# Patient Record
Sex: Male | Born: 1980 | Race: White | Hispanic: No | Marital: Single | Smoking: Former smoker
Health system: Southern US, Community
[De-identification: ages and names within clinical notes are randomized; demographics above are authoritative.]

## PROBLEM LIST (undated history)

## (undated) DIAGNOSIS — J342 Deviated nasal septum: Secondary | ICD-10-CM

## (undated) DIAGNOSIS — J343 Hypertrophy of nasal turbinates: Secondary | ICD-10-CM

## (undated) DIAGNOSIS — F1910 Other psychoactive substance abuse, uncomplicated: Secondary | ICD-10-CM

## (undated) HISTORY — DX: Other psychoactive substance abuse, uncomplicated: F19.10

## (undated) HISTORY — DX: Deviated nasal septum: J34.2

## (undated) HISTORY — PX: OTHER SURGICAL HISTORY: SHX169

## (undated) HISTORY — DX: Hypertrophy of nasal turbinates: J34.3

---

## 2014-09-23 ENCOUNTER — Ambulatory Visit (INDEPENDENT_AMBULATORY_CARE_PROVIDER_SITE_OTHER): Payer: BLUE CROSS/BLUE SHIELD | Admitting: Internal Medicine

## 2014-09-23 VITALS — BP 120/66 | HR 83 | Temp 98.0°F | Resp 18 | Ht 78.0 in | Wt 240.0 lb

## 2014-09-23 DIAGNOSIS — B354 Tinea corporis: Secondary | ICD-10-CM | POA: Diagnosis not present

## 2014-09-23 MED ORDER — KETOCONAZOLE 2 % EX CREA: 1.0000 "application " | TOPICAL_CREAM | Freq: Every day | CUTANEOUS | Status: DC

## 2014-09-23 NOTE — Progress Notes (Signed)
° °  Subjective:  This chart was scribed for Oscar Sia, MD by Broadus John, Medical Scribe. This patient was seen in Room 10 and the patient's care was started at 4:06 PM.   Patient ID: Oscar Alexander, male    DOB: Jul 28, 1980, 34 y.o.   MRN: [id number]  Chief Complaint  Patient presents with   Tinea    abd area and rt shoulder     HPI HPI Comments: Oscar Alexander is a 35 y.o. male who presents to [facility] complaining of a possible tinea. Pt notes that he has recently got a new cat 3 months old about 2 weeks ago. He reports that the cat was treated for the symptoms of tinea. Pt states that he now has skin rash on his lower abdominal and right shoulder area.    There are no active problems to display for this patient.  Past Medical History  Diagnosis Date   Substance abuse    Past Surgical History  Procedure Laterality Date   Nuss for pe     No Known Allergies Prior to Admission medications   Not on File   History   Social History   Marital Status: Single    Spouse Name: Oscar Alexander   Number of Children: Oscar Alexander   Years of Education: Oscar Alexander   Occupational History   Not on file.   Social History Main Topics   Smoking status: Current Every Day Smoker -- 1.00 packs/day for 1 years    Types: Cigarettes   Smokeless tobacco: Not on file   Alcohol Use: No   Drug Use: No   Sexual Activity: Not on file   Other Topics Concern   Not on file   Social History Narrative   No narrative on file    Review of Systems  Skin: Positive for rash.      Objective:   Physical Exam  Constitutional: He is oriented to person, place, and time. He appears well-developed and well-nourished. No distress.  HENT:  Head: Normocephalic and atraumatic.  Eyes: EOM are normal. Pupils are equal, round, and reactive to light.  Neck: Neck supple.  Cardiovascular: Normal rate.   Pulmonary/Chest: Effort normal.  Neurological: He is alert and oriented to person,  place, and time. No cranial nerve deficit.  Skin: Skin is warm and dry.  He has 2 erythematous papular circular lesions with well-defined borders one in the left inguinal area and one on the right shoulder that have scaly surfaces  Psychiatric: He has a normal mood and affect. His behavior is normal.  Nursing note and vitals reviewed.  BP 120/66 mmHg   Pulse 83   Temp(Src) 98 F (36.7 C) (Oral)   Resp 18   Ht  (1.981 m)   Wt 240 lb (108.863 kg)   BMI 27.74 kg/m2   SpO2 99%     Assessment & Plan:  I have completed the patient encounter in its entirety as documented by the scribe, with editing by me where necessary. Oscar Alexander, M.D.  Tinea corporis  Nizoral daily for one month

## 2014-10-26 ENCOUNTER — Ambulatory Visit (INDEPENDENT_AMBULATORY_CARE_PROVIDER_SITE_OTHER): Payer: BLUE CROSS/BLUE SHIELD | Admitting: Internal Medicine

## 2014-10-26 VITALS — BP 100/68 | HR 79 | Temp 98.2°F | Ht 78.0 in | Wt 230.5 lb

## 2014-10-26 DIAGNOSIS — H00036 Abscess of eyelid left eye, unspecified eyelid: Secondary | ICD-10-CM | POA: Diagnosis not present

## 2014-10-26 MED ORDER — DOXYCYCLINE HYCLATE 100 MG PO TABS: 100.0000 mg | ORAL_TABLET | Freq: Two times a day (BID) | ORAL | Status: DC

## 2014-10-26 NOTE — Progress Notes (Signed)
   Subjective:  This chart was scribed for Oscar Sia, MD by Stann Ore, Medical Scribe. This patient was seen in Room 9 and the patient's care was started at 2:46 PM.     Patient ID: Oscar Alexander, male    DOB: Sep 30, 1980, 34 y.o.   MRN: [id number] Chief Complaint  Patient presents with  . Stye    C/O left eye pain & irritation since Thurs night. Worse today & now has a headache    HPI Oscar Alexander is a 34 y.o. male who presents to [facility] complaining of a stye in his left eye that started 4 days ago. He noticed more swelling this morning. He denies any drainage from the area. He's been applying heat to the area.    There are no active problems to display for this patient.   Current outpatient prescriptions:  .  ketoconazole (NIZORAL) 2 % cream, Apply 1 application topically daily. (Patient not taking: Reported on 10/26/2014), Disp: 15 g, Rfl: 0    Review of Systems  Eyes: Positive for pain (left eye stye). Negative for discharge and visual disturbance.       Objective:   Physical Exam  Constitutional: He is oriented to person, place, and time. He appears well-developed and well-nourished. No distress.  HENT:  Head: Normocephalic and atraumatic.  Eyes: EOM are normal. Pupils are equal, round, and reactive to light.  Left upper lid red and swollen with a pustule at the base of a central hair follicle. Conjunctiva clear  Neck: Neck supple.  Cardiovascular: Normal rate.   Pulmonary/Chest: Effort normal. No respiratory distress.  Musculoskeletal: Normal range of motion.  Neurological: He is alert and oriented to person, place, and time.  Skin: Skin is warm and dry.  Psychiatric: He has a normal mood and affect. His behavior is normal.  Nursing note and vitals reviewed.   BP 100/68 mmHg  Pulse 79  Temp(Src) 98.2 F (36.8 C) (Oral)  Ht  (1.981 m)  Wt 230 lb 8 oz (104.554 kg)  BMI 26.64 kg/m2  SpO2 99%       Assessment & Plan:   Cellulitis of eyelid,  left  Meds ordered this encounter  Medications  . doxycycline (VIBRA-TABS) 100 MG tablet    Sig: Take 1 tablet (100 mg total) by mouth 2 (two) times daily.    Dispense:  20 tablet    Refill:  0  hot compr qid 5'

## 2015-03-13 ENCOUNTER — Ambulatory Visit (INDEPENDENT_AMBULATORY_CARE_PROVIDER_SITE_OTHER): Payer: PRIVATE HEALTH INSURANCE | Admitting: Physician Assistant

## 2015-03-13 VITALS — BP 132/80 | HR 99 | Temp 98.4°F | Resp 18 | Ht 78.0 in | Wt 251.4 lb

## 2015-03-13 DIAGNOSIS — B349 Viral infection, unspecified: Secondary | ICD-10-CM

## 2015-03-13 LAB — POCT INFLUENZA A/B
INFLUENZA A, POC: NEGATIVE
INFLUENZA B, POC: NEGATIVE

## 2015-03-13 MED ORDER — IPRATROPIUM BROMIDE 0.03 % NA SOLN: 2.0000 | Freq: Two times a day (BID) | NASAL | Status: DC

## 2015-03-13 MED ORDER — GUAIFENESIN ER 1200 MG PO TB12: 1.0000 | ORAL_TABLET | Freq: Two times a day (BID) | ORAL | Status: DC | PRN

## 2015-03-13 MED ORDER — HYDROCOD POLST-CPM POLST ER 10-8 MG/5ML PO SUER: 5.0000 mL | Freq: Two times a day (BID) | ORAL | Status: DC | PRN

## 2015-03-13 NOTE — Patient Instructions (Signed)
Upper Respiratory Infection, Adult Most upper respiratory infections (URIs) are a viral infection of the air passages leading to the lungs. A URI affects the nose, throat, and upper air passages. The most common type of URI is nasopharyngitis and is typically referred to as "the common cold." URIs run their course and usually go away on their own. Most of the time, a URI does not require medical attention, but sometimes a bacterial infection in the upper airways can follow a viral infection. This is called a secondary infection. Sinus and middle ear infections are common types of secondary upper respiratory infections. Bacterial pneumonia can also complicate a URI. A URI can worsen asthma and chronic obstructive pulmonary disease (COPD). Sometimes, these complications can require emergency medical care and may be life threatening.  CAUSES Almost all URIs are caused by viruses. A virus is a type of germ and can spread from one person to another.  RISKS FACTORS You may be at risk for a URI if:   You smoke.   You have chronic heart or lung disease.  You have a weakened defense (immune) system.   You are very young or very old.   You have nasal allergies or asthma.  You work in crowded or poorly ventilated areas.  You work in health care facilities or schools. SIGNS AND SYMPTOMS  Symptoms typically develop 2-3 days after you come in contact with a cold virus. Most viral URIs last 7-10 days. However, viral URIs from the influenza virus (flu virus) can last 14-18 days and are typically more severe. Symptoms may include:   Runny or stuffy (congested) nose.   Sneezing.   Cough.   Sore throat.   Headache.   Fatigue.   Fever.   Loss of appetite.   Pain in your forehead, behind your eyes, and over your cheekbones (sinus pain).  Muscle aches.  DIAGNOSIS  Your health care provider may diagnose a URI by:  Physical exam.  Tests to check that your symptoms are not due to  another condition such as:  Strep throat.  Sinusitis.  Pneumonia.  Asthma. TREATMENT  A URI goes away on its own with time. It cannot be cured with medicines, but medicines may be prescribed or recommended to relieve symptoms. Medicines may help:  Reduce your fever.  Reduce your cough.  Relieve nasal congestion. HOME CARE INSTRUCTIONS   Take medicines only as directed by your health care provider.   Gargle warm saltwater or take cough drops to comfort your throat as directed by your health care provider.  Use a warm mist humidifier or inhale steam from a shower to increase air moisture. This may make it easier to breathe.  Drink enough fluid to keep your urine clear or pale yellow.   Eat soups and other clear broths and maintain good nutrition.   Rest as needed.   Return to work when your temperature has returned to normal or as your health care provider advises. You may need to stay home longer to avoid infecting others. You can also use a face mask and careful hand washing to prevent spread of the virus.  Increase the usage of your inhaler if you have asthma.   Do not use any tobacco products, including cigarettes, chewing tobacco, or electronic cigarettes. If you need help quitting, ask your health care provider. PREVENTION  The best way to protect yourself from getting a cold is to practice good hygiene.   Avoid oral or hand contact with people with cold   symptoms.   Wash your hands often if contact occurs.  There is no clear evidence that vitamin C, vitamin E, echinacea, or exercise reduces the chance of developing a cold. However, it is always recommended to get plenty of rest, exercise, and practice good nutrition.  SEEK MEDICAL CARE IF:   You are getting worse rather than better.   Your symptoms are not controlled by medicine.   You have chills.  You have worsening shortness of breath.  You have brown or red mucus.  You have yellow or brown nasal  discharge.  You have pain in your face, especially when you bend forward.  You have a fever.  You have swollen neck glands.  You have pain while swallowing.  You have white areas in the back of your throat. SEEK IMMEDIATE MEDICAL CARE IF:   You have severe or persistent:  Headache.  Ear pain.  Sinus pain.  Chest pain.  You have chronic lung disease and any of the following:  Wheezing.  Prolonged cough.  Coughing up blood.  A change in your usual mucus.  You have a stiff neck.  You have changes in your:  Vision.  Hearing.  Thinking.  Mood. MAKE SURE YOU:   Understand these instructions.  Will watch your condition.  Will get help right away if you are not doing well or get worse.   This information is not intended to replace advice given to you by your health care provider. Make sure you discuss any questions you have with your health care provider.   Document Released: 08/10/2000 Document Revised: 07/01/2014 Document Reviewed: 05/22/2013 Elsevier Interactive Patient Education 2016 Elsevier Inc[employer]  

## 2015-03-13 NOTE — Progress Notes (Signed)
Patient ID: Oscar Alexander, male    DOB: [birth year], 35 y.o.   MRN: [id number]  PCP: No PCP Per Patient  Subjective:   Chief Complaint  Patient presents with  . URI    x3 days, productive cough with yellow phlegm     HPI Presents for evaluation of illness. 3 days of worsening symptoms. Feels achy all over. Skin crawling. "Just feel awful." Cold symptoms seem pretty mild compared to what he normally gets. No nausea, vomiting or diarrhea. No GU changes. Subjective fever. OTC Nyquil last night helped him sleep through the night.  Review of Systems  Constitutional: Positive for fever and fatigue. Negative for chills and diaphoresis.  HENT: Positive for congestion, ear pain, postnasal drip, rhinorrhea, sinus pressure and sore throat. Negative for trouble swallowing and voice change.   Eyes: Negative for pain, redness and itching.  Respiratory: Positive for cough. Negative for shortness of breath and wheezing.   Cardiovascular: Negative for chest pain and palpitations.  Gastrointestinal: Negative for nausea, vomiting and diarrhea.  Genitourinary: Negative for dysuria, urgency, frequency and hematuria.  Musculoskeletal: Positive for myalgias. Negative for joint swelling, arthralgias, neck pain and neck stiffness.  Skin: Negative for rash.  Neurological: Negative for dizziness and headaches.  Hematological: Negative for adenopathy.       There are no active problems to display for this patient.    Prior to Admission medications   Not on File     No Known Allergies     Objective:  Physical Exam  Constitutional: He is oriented to person, place, and time. Vital signs are normal. He appears well-developed and well-nourished. No distress.  BP 132/80 mmHg  Pulse 99  Temp(Src) 98.4 F (36.9 C) (Oral)  Resp 18  Ht 6\' 6"  (1.981 m)  Wt 251 lb 6.4 oz (114.034 kg)  BMI 29.06 kg/m2  SpO2 98%   HENT:  Head: Normocephalic and atraumatic.  Right Ear: Hearing, external ear  and ear canal normal. Tympanic membrane is injected and erythematous. Tympanic membrane is not perforated, not retracted and not bulging. No middle ear effusion.  Left Ear: Hearing, tympanic membrane, external ear and ear canal normal.  Nose: Mucosal edema and rhinorrhea present.  No foreign bodies. Right sinus exhibits no maxillary sinus tenderness and no frontal sinus tenderness. Left sinus exhibits no maxillary sinus tenderness and no frontal sinus tenderness.  Mouth/Throat: Uvula is midline, oropharynx is clear and moist and mucous membranes are normal. No uvula swelling. No oropharyngeal exudate.  Eyes: Conjunctivae and EOM are normal. Pupils are equal, round, and reactive to light. Right eye exhibits no discharge. Left eye exhibits no discharge. No scleral icterus.  Neck: Trachea normal, normal range of motion and full passive range of motion without pain. Neck supple. No thyroid mass and no thyromegaly present.  Cardiovascular: Normal rate, regular rhythm and normal heart sounds.   Pulmonary/Chest: Effort normal and breath sounds normal.  Lymphadenopathy:       Head (right side): No submandibular, no tonsillar, no preauricular, no posterior auricular and no occipital adenopathy present.       Head (left side): No submandibular, no tonsillar, no preauricular and no occipital adenopathy present.    He has no cervical adenopathy.       Right: No supraclavicular adenopathy present.       Left: No supraclavicular adenopathy present.  Neurological: He is alert and oriented to person, place, and time. He has normal strength. No cranial nerve deficit or sensory deficit.  Skin: Skin  is warm, dry and intact. No rash noted.  Psychiatric: He has a normal mood and affect. His speech is normal and behavior is normal.       Results for orders placed or performed in visit on 03/13/15  POCT Influenza A/B  Result Value Ref Range   Influenza A, POC Negative Negative   Influenza B, POC Negative Negative        Assessment & Plan:   1. Viral illness Supportive care. Rest, oral hydration. Declines Tussionex due to history of substance abuse (nearly 1 year clean) and tessalon because they haven't been effective for him previously. Anticipatory guidance. - POCT Influenza A/B - ipratropium (ATROVENT) 0.03 % nasal spray; Place 2 sprays into both nostrils 2 (two) times daily.  Dispense: 30 mL; Refill: 0 - Guaifenesin (MUCINEX MAXIMUM STRENGTH) 1200 MG TB12; Take 1 tablet (1,200 mg total) by mouth every 12 (twelve) hours as needed.  Dispense: 14 tablet; Refill: 1  Return if symptoms worsen or fail to improve.   Fernande Brashelle S. Aryaman Haliburton, PA-C Physician Assistant-Certified [facility]

## 2015-04-08 ENCOUNTER — Ambulatory Visit (INDEPENDENT_AMBULATORY_CARE_PROVIDER_SITE_OTHER): Payer: PRIVATE HEALTH INSURANCE | Admitting: Emergency Medicine

## 2015-04-08 ENCOUNTER — Ambulatory Visit (INDEPENDENT_AMBULATORY_CARE_PROVIDER_SITE_OTHER): Payer: PRIVATE HEALTH INSURANCE

## 2015-04-08 VITALS — BP 140/64 | HR 113 | Temp 100.2°F | Resp 17 | Ht 78.0 in | Wt 257.0 lb

## 2015-04-08 DIAGNOSIS — R509 Fever, unspecified: Secondary | ICD-10-CM | POA: Diagnosis not present

## 2015-04-08 DIAGNOSIS — T8090XA Unspecified complication following infusion and therapeutic injection, initial encounter: Secondary | ICD-10-CM | POA: Diagnosis not present

## 2015-04-08 DIAGNOSIS — J029 Acute pharyngitis, unspecified: Secondary | ICD-10-CM | POA: Diagnosis not present

## 2015-04-08 DIAGNOSIS — B349 Viral infection, unspecified: Secondary | ICD-10-CM

## 2015-04-08 LAB — POCT INFLUENZA A/B
INFLUENZA A, POC: NEGATIVE
INFLUENZA B, POC: NEGATIVE

## 2015-04-08 LAB — POCT CBC
Granulocyte percent: 89.5 %G — AB (ref 37–80)
HCT, POC: 44.2 % (ref 43.5–53.7)
HEMOGLOBIN: 15 g/dL (ref 14.1–18.1)
Lymph, poc: 1 (ref 0.6–3.4)
MCH, POC: 29.3 pg (ref 27–31.2)
MCHC: 34 g/dL (ref 31.8–35.4)
MCV: 86.2 fL (ref 80–97)
MID (CBC): 0.4 (ref 0–0.9)
MPV: 8.4 fL (ref 0–99.8)
POC Granulocyte: 12 — AB (ref 2–6.9)
POC LYMPH %: 7.3 % — AB (ref 10–50)
POC MID %: 3.2 % (ref 0–12)
Platelet Count, POC: 165 10*3/uL (ref 142–424)
RBC: 5.12 M/uL (ref 4.69–6.13)
RDW, POC: 14.9 %
WBC: 13.4 10*3/uL — AB (ref 4.6–10.2)

## 2015-04-08 LAB — COMPLETE METABOLIC PANEL WITH GFR
ALBUMIN: 4.2 g/dL (ref 3.6–5.1)
ALK PHOS: 83 U/L (ref 40–115)
ALT: 18 U/L (ref 9–46)
AST: 23 U/L (ref 10–40)
BILIRUBIN TOTAL: 0.6 mg/dL (ref 0.2–1.2)
BUN: 16 mg/dL (ref 7–25)
CO2: 27 mmol/L (ref 20–31)
Calcium: 9.3 mg/dL (ref 8.6–10.3)
Chloride: 99 mmol/L (ref 98–110)
Creat: 1.05 mg/dL (ref 0.60–1.35)
GLUCOSE: 84 mg/dL (ref 65–99)
POTASSIUM: 4.7 mmol/L (ref 3.5–5.3)
SODIUM: 136 mmol/L (ref 135–146)
TOTAL PROTEIN: 7.2 g/dL (ref 6.1–8.1)

## 2015-04-08 LAB — POCT RAPID STREP A (OFFICE): RAPID STREP A SCREEN: NEGATIVE

## 2015-04-08 MED ORDER — CEPHALEXIN 500 MG PO CAPS
500.0000 mg | ORAL_CAPSULE | Freq: Three times a day (TID) | ORAL | Status: DC
Start: 2015-04-08 — End: 2016-05-30

## 2015-04-08 NOTE — Progress Notes (Addendum)
Patient ID: Oscar Alexander, male   DOB: [birth year], 36 y.o.   MRN: [id number]    By signing my name below, I, Essence Howell, attest that this documentation has been prepared under the direction and in the presence of Collene Gobble, MD Electronically Signed: Charline Bills, ED Scribe 04/08/2015 at 12:30 PM.  Chief Complaint:  Chief Complaint  Patient presents with  . Diarrhea  . Chills  . Sore Throat  . Otalgia  . Headache   HPI: Oscar Alexander is a 35 y.o. male who reports to [facility] today complaining of persistent sore throat onset yesterday. Pt states that sore throat started as a scratchy sensation. He reports associated fatigue, fever, chills, loss of appetite, ear pain, generalized body aches, back pain, HA, 2 episodes of diarrhea today. Triage temperature 100.2 F. No treatments tried PTA. He denies cough. Pt was also seen in the office on 03/13/15 for a viral illness with similar symptoms; tested negative for the flu at that visit. He did not have a flu vaccine this season.   Body Building Pt is currently in a body building program doing testosterone injections every 3 days. He reports similar symptoms following 1 of his testosterone injections.   Pt is a Community education officer.   Past Medical History  Diagnosis Date  . Substance abuse    Past Surgical History  Procedure Laterality Date  . Nuss for pe     Social History   Social History  . Marital Status: Single    Spouse Name: n/a  . Number of Children: 0  . Years of Education: N/A   Occupational History  . car sales    Social History Main Topics  . Smoking status: Current Every Day Smoker -- 1.00 packs/day for 1 years    Types: Cigarettes  . Smokeless tobacco: Never Used  . Alcohol Use: No  . Drug Use: No  . Sexual Activity: Not Asked   Other Topics Concern  . None   Social History Narrative   Lives with a roommate.   Family History  Problem Relation Age of Onset  . Heart disease Maternal Grandfather   . Cancer  Paternal Grandmother    No Known Allergies Prior to Admission medications   Not on File   ROS: The patient denies night sweats, unintentional weight loss, chest pain, palpitations, -cough, wheezing, dyspnea on exertion, nausea, vomiting, abdominal pain, dysuria, hematuria, melena, numbness, weakness, or tingling. +fatigue, +fever, +chills, +appetite change, +sore throat, +ear pain, +diarrhea, +myalagias, +back pain, +HA  All other systems have been reviewed and were otherwise negative with the exception of those mentioned in the HPI and as above.    PHYSICAL EXAM: Filed Vitals:   04/08/15 1100  BP: 140/64  Pulse: 113  Temp: 100.2 F (37.9 C)  Resp: 17   Body mass index is 29.71 kg/(m^2).  General: Alert, no acute distress HEENT:  Normocephalic, atraumatic, oropharynx patent. L TM is slightly red. Throat normal.  Eye: E[facility], [facility] Cardiovascular:  Regular rate and rhythm, no rubs murmurs or gallops. No Carotid bruits, radial pulse intact. No pedal edema.  Respiratory: Clear to auscultation bilaterally. No wheezes, rales, or rhonchi. No cyanosis, no use of accessory musculature Abdominal: No organomegaly, abdomen is soft and non-tender, positive bowel sounds. No masses. Musculoskeletal: Gait intact. No edema, tenderness GU: the L buttock is firm and tender to touch. There is asymmetry in the buttock, L greater than R.  Skin: No rashes. Neurologic: Facial musculature symmetric. Psychiatric: Patient acts appropriately  throughout our interaction. Lymphatic: No cervical or submandibular lymphadenopathy  LABS: Results for orders placed or performed in visit on 04/08/15  POCT Influenza A/B  Result Value Ref Range   Influenza A, POC Negative Negative   Influenza B, POC Negative Negative  POCT rapid strep A  Result Value Ref Range   Rapid Strep A Screen Negative Negative  POCT CBC  Result Value Ref Range   WBC 13.4 (A) 4.6 - 10.2 K/uL   Lymph, poc 1.0 0.6 - 3.4   POC LYMPH  PERCENT 7.3 (A) 10 - 50 %L   MID (cbc) 0.4 0 - 0.9   POC MID % 3.2 0 - 12 %M   POC Granulocyte 12.0 (A) 2 - 6.9   Granulocyte percent 89.5 (A) 37 - 80 %G   RBC 5.12 4.69 - 6.13 M/uL   Hemoglobin 15.0 14.1 - 18.1 g/dL   HCT, POC 91.4 78.2 - 53.7 %   MCV 86.2 80 - 97 fL   MCH, POC 29.3 27 - 31.2 pg   MCHC 34.0 31.8 - 35.4 g/dL   RDW, POC 95.6 %   Platelet Count, POC 165 142 - 424 K/uL   MPV 8.4 0 - 99.8 fL   Results for orders placed or performed in visit on 03/13/15  POCT Influenza A/B  Result Value Ref Range   Influenza A, POC Negative Negative   Influenza B, POC Negative Negative   EKG/XRAY:   Primary read interpreted by Dr. Cleta Alberts at [facility]. Dg Chest 2 View  04/08/2015  CLINICAL DATA:  Cough and fever with cold symptoms. History of thoracic surgery. EXAM: CHEST  2 VIEW COMPARISON:  None. FINDINGS: The heart size and mediastinal contours are normal. The lungs are clear there is no pleural effusion or pneumothorax. 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 No acute osseous findings are seen. IMPRESSION: No active cardiopulmonary process. Electronically Signed   By: Carey Bullocks M.D.   On: 04/08/2015 13:15   ASSESSMENT/PLAN: Not clear what is going on. He has flulike symptoms but also has tenderness over his left buttocks where he recently gave a testosterone injection. He certainly could be developing a access in this area. It is also possible he is having some type of drug reaction to the testosterone. I advised him not to do any more testosterone injections. I did place him on cephalexin 3 times a day. He will recheck on Friday for follow-up. He was given a note out of work. Of note these symptoms were very similar to what he had 3 weeks ago when he was seen here and also this followed an injection of the testosterone. Will add a cemented check his LFTs.I personally performed the services described in this  documentation, which was scribed in my presence. The recorded information has been reviewed and is accurate.    Gross sideeffects, risk and benefits, and alternatives of medications d/w patient. Patient is aware that all medications have potential sideeffects and we are unable to predict every sideeffect or drug-drug interaction that may occur.  Lesle Chris MD 04/08/2015 12:18 PM

## 2015-04-08 NOTE — Patient Instructions (Signed)
Because you received an x-ray today, you will receive an invoice from [facility]. Please contact [facility] at [phone] with questions or concerns regarding your invoice. Our billing staff will not be able to assist you with those questions. °

## 2016-03-23 DIAGNOSIS — J343 Hypertrophy of nasal turbinates: Secondary | ICD-10-CM

## 2016-03-23 DIAGNOSIS — J342 Deviated nasal septum: Secondary | ICD-10-CM

## 2016-03-23 HISTORY — DX: Hypertrophy of nasal turbinates: J34.3

## 2016-03-23 HISTORY — DX: Deviated nasal septum: J34.2

## 2016-05-29 ENCOUNTER — Encounter (HOSPITAL_COMMUNITY): Payer: Self-pay | Admitting: Emergency Medicine

## 2016-05-29 ENCOUNTER — Ambulatory Visit (HOSPITAL_COMMUNITY)
Admission: EM | Admit: 2016-05-29 | Discharge: 2016-05-29 | Disposition: A | Discharge status: Home / Self Care | Payer: BLUE CROSS/BLUE SHIELD | Attending: Family Medicine | Admitting: Family Medicine

## 2016-05-29 DIAGNOSIS — M545 Low back pain, unspecified: Secondary | ICD-10-CM

## 2016-05-29 MED ORDER — BUPIVACAINE HCL (PF) 0.5 % IJ SOLN
INTRAMUSCULAR | Status: AC
  Filled 2016-05-29: qty 10

## 2016-05-29 MED ORDER — METHOCARBAMOL 500 MG PO TABS: 500.0000 mg | ORAL_TABLET | Freq: Two times a day (BID) | ORAL | 0 refills | Status: DC

## 2016-05-29 MED ORDER — TRIAMCINOLONE ACETONIDE 40 MG/ML IJ SUSP
INTRAMUSCULAR | Status: AC
Start: 2016-05-29 — End: 2016-05-29
  Filled 2016-05-29: qty 1

## 2016-05-29 MED ORDER — PREDNISONE 50 MG PO TABS: ORAL_TABLET | ORAL | 0 refills | Status: DC

## 2016-05-29 NOTE — ED Triage Notes (Signed)
PT reports he worked out legs at the gym Wednesday night and placed left leg up on table to tie his shoe. PT reports upon putting foot back on the floor he experienced severe pain in left lower back. PT reports pain has worsened since Wednesday. PT has had two doses of 800 mg ibuprofen today. PT has used prednisone in the past for similar injury with relief.

## 2016-05-29 NOTE — Discharge Instructions (Signed)
You have received a trigger point injection of bupivacaine, and Kenalog into your back. I have prescribed Robaxin as a muscle relaxant, this medicine may cause drowsiness, do not drink alcohol or drive while taking it. Also prescribed a short dose of steroids, take one tablet daily with food starting tomorrow. Should your pain persist, follow-up with primary care provider, or return to clinic

## 2016-05-29 NOTE — ED Provider Notes (Signed)
CSN: [id number]     Arrival date & time 05/29/16  1921 History   First MD Initiated Contact with Patient 05/29/16 1955     Chief Complaint  Patient presents with  . Back Pain   (Consider location/radiation/quality/duration/timing/severity/associated sxs/prior Treatment) 36 year old male presents to clinic with a chief complaint of lower back pain for 3-4 days. Occurred while he was at the gym, he states he had lifted his leg onto a bench to tie shoe, and he experienced a sharp shooting pain in his back. He has had prior back pain like this in the past, treated successfully with steroids and muscle relaxants.   The history is provided by the patient.  Back Pain  Location:  Lumbar spine Quality:  Cramping, shooting and stabbing Radiates to:  Does not radiate Pain severity:  Severe Pain is:  Same all the time Onset quality:  Gradual Duration:  3 days Timing:  Constant Chronicity:  Recurrent Context: lifting heavy objects, recent injury and twisting   Context: not falling, not MCA, not MVA and not physical stress   Relieved by:  Nothing Worsened by:  Nothing Ineffective treatments:  Ibuprofen, NSAIDs and OTC medications Associated symptoms: no abdominal pain, no bladder incontinence, no bowel incontinence, no chest pain, no dysuria, no fever, no headaches, no numbness, no paresthesias, no tingling and no weakness     Past Medical History:  Diagnosis Date  . Substance abuse    Past Surgical History:  Procedure Laterality Date  . nuss for PE     Family History  Problem Relation Age of Onset  . Heart disease Maternal Grandfather   . Cancer Paternal Grandmother    Social History  Substance Use Topics  . Smoking status: Current Every Day Smoker    Packs/day: 0.00    Years: 1.00  . Smokeless tobacco: Current User  . Alcohol use No    Review of Systems  Constitutional: Negative for chills and fever.  Respiratory: Negative for cough, shortness of breath and wheezing.     Cardiovascular: Negative for chest pain.  Gastrointestinal: Negative for abdominal pain, bowel incontinence and nausea.  Genitourinary: Negative for bladder incontinence and dysuria.  Musculoskeletal: Positive for back pain. Negative for neck pain and neck stiffness.  Neurological: Negative for tingling, weakness, numbness, headaches and paresthesias.    Allergies  Skelaxin [metaxalone]  Home Medications   Prior to Admission medications   Medication Sig Start Date End Date Taking? Authorizing Provider  cephALEXin (KEFLEX) 500 MG capsule Take 1 capsule (500 mg total) by mouth 3 (three) times daily. 04/08/15   Collene Gobble, MD  methocarbamol (ROBAXIN) 500 MG tablet Take 1 tablet (500 mg total) by mouth 2 (two) times daily. 05/29/16   Dorena Bodo, NP  predniSONE (DELTASONE) 50 MG tablet Take 1 tablet daily with food 05/29/16   Dorena Bodo, NP   Meds Ordered and Administered this Visit  Medications - No data to display  BP (!) 158/84   Pulse 100   Temp 99 F (37.2 C) (Oral)   Resp 16   Ht  (1.981 m)   Wt 248 lb (112.5 kg)   SpO2 99%   BMI 28.66 kg/m  No data found.   Physical Exam  Constitutional: He is oriented to person, place, and time. He appears well-developed and well-nourished. No distress.  HENT:  Head: Normocephalic and atraumatic.  Right Ear: External ear normal.  Left Ear: External ear normal.  Cardiovascular: Normal rate and regular rhythm.  Pulmonary/Chest: Effort normal and breath sounds normal.  Musculoskeletal:       Lumbar back: He exhibits swelling, pain and spasm.       Back:  Neurological: He is alert and oriented to person, place, and time.  Skin: Skin is warm and dry. Capillary refill takes less than 2 seconds. He is not diaphoretic.  Psychiatric: He has a normal mood and affect. His behavior is normal.  Nursing note and vitals reviewed.   Urgent Care Course     .Joint Aspiration/Arthrocentesis Date/Time: 05/29/2016 8:23  PM Performed by: Dorena Bodo Authorized by: Elvina Sidle   Consent:    Consent obtained:  Verbal   Consent given by:  Patient   Risks discussed:  Bleeding, infection, nerve damage and pain   Alternatives discussed:  No treatment, alternative treatment and delayed treatment Location:    Location: muscle of the lower back above the hip. Anesthesia (see MAR for exact dosages):    Anesthesia method:  Local infiltration   Local anesthetic:  Bupivacaine 0.5% w/o epi Procedure details:    Needle gauge:  22 G   Ultrasound guidance: no     Approach:  Posterior   Aspirate amount:  0   Steroid injected: yes     Specimen collected: no   Post-procedure details:    Dressing:  Adhesive bandage   Patient tolerance of procedure:  Tolerated well, no immediate complications Comments:     Trigger point injection to a muscle spasm in the lower back, no return on aspiration, bupivacaine and Kenalog injected into the site, pt experienced significant relief.   (including critical care time)  Labs Review Labs Reviewed - No data to display  Imaging Review No results found.         MDM   1. Acute left-sided low back pain without sciatica    Trigger point injection done in clinic, with significant reported relief. Prescription given for short course of prednisone, and Robaxin as a muscle relaxant. Should symptoms persist, follow-up with primary care, or return to clinic.    Dorena Bodo, NP 05/29/16 2028

## 2016-05-30 ENCOUNTER — Ambulatory Visit (INDEPENDENT_AMBULATORY_CARE_PROVIDER_SITE_OTHER): Payer: BLUE CROSS/BLUE SHIELD | Admitting: Physician Assistant

## 2016-05-30 ENCOUNTER — Ambulatory Visit (INDEPENDENT_AMBULATORY_CARE_PROVIDER_SITE_OTHER): Payer: BLUE CROSS/BLUE SHIELD

## 2016-05-30 VITALS — BP 132/68 | HR 109 | Resp 14 | Ht 78.0 in | Wt 246.0 lb

## 2016-05-30 DIAGNOSIS — M51369 Other intervertebral disc degeneration, lumbar region without mention of lumbar back pain or lower extremity pain: Secondary | ICD-10-CM

## 2016-05-30 DIAGNOSIS — M5441 Lumbago with sciatica, right side: Secondary | ICD-10-CM

## 2016-05-30 DIAGNOSIS — M5136 Other intervertebral disc degeneration, lumbar region: Secondary | ICD-10-CM

## 2016-05-30 MED ORDER — PREDNISONE 20 MG PO TABS: ORAL_TABLET | ORAL | 0 refills | Status: DC

## 2016-05-30 MED ORDER — CYCLOBENZAPRINE HCL 10 MG PO TABS: 10.0000 mg | ORAL_TABLET | Freq: Three times a day (TID) | ORAL | 0 refills | Status: DC | PRN

## 2016-05-30 MED ORDER — HYDROCODONE-ACETAMINOPHEN 7.5-325 MG PO TABS: 1.0000 | ORAL_TABLET | Freq: Every evening | ORAL | 0 refills | Status: DC | PRN

## 2016-05-30 NOTE — Patient Instructions (Signed)
     IF you received an x-ray today, you will receive an invoice from [facility]. Please contact [facility] at [phone] with questions or concerns regarding your invoice.   IF you received labwork today, you will receive an invoice from [facility]. Please contact [facility] at [phone] with questions or concerns regarding your invoice.   Our billing staff will not be able to assist you with questions regarding bills from these companies.  You will be contacted with the lab results as soon as they are available. The fastest way to get your results is to activate your My Chart account. Instructions are located on the last page of this paperwork. If you have not heard from us regarding the results in 2 weeks, please contact this office.     

## 2016-05-30 NOTE — Progress Notes (Signed)
Patient ID: Oscar Alexander, male    DOB: 11/01/1980, 36 y.o.   MRN: [id number]  PCP: No PCP Per Patient  Chief Complaint  Patient presents with  . Back Pain    Subjective:   Presents for evaluation of back pain.  Pain began 05/25/2016. That he had just finished a leg work out, and rested his foot on a bench to untie his shoe. When he lifted his foot off the bench, he felt the back lock up. History of back injury in college doing squats in the gym. Has had frequent pain in the back since then, but nothing like this. No other injury he can recall, though he played baseball and describes lots of rotational activities. Got a massage yesterday, and his very sore legs improved, but then his back pain got significantly worse.  Seen at [facility] yesterday. Received an injection (trigger point, with bupivacaine and Kenalog) which was helpful immediately following, but by the time got to the car, he was having no benefit. Prescriptions for oral methocarbamol and prednisone without benefit.  Sitting is the most uncomfortable, following by sit-to-stand and vice-versa.  No loss of bowel or bladder control. No weakness of the lower extremities. Pain radiates into the LEFT buttock. No saddle anesthesia.  Review of Systems As above.    Patient Active Problem List   Diagnosis Date Noted  . Deviated nasal septum 03/23/2016  . Nasal turbinate hypertrophy 03/23/2016     Prior to Admission medications   Medication Sig Start Date End Date Taking? Authorizing Provider  methocarbamol (ROBAXIN) 500 MG tablet Take 1 tablet (500 mg total) by mouth 2 (two) times daily. 05/29/16  Yes Dorena Bodo, NP  predniSONE (DELTASONE) 50 MG tablet Take 1 tablet daily with food 05/29/16  Yes Dorena Bodo, NP  mupirocin ointment (BACTROBAN) 2 %  05/25/16   Historical Provider, MD     Allergies  Allergen Reactions  . Skelaxin [Metaxalone] Rash       Objective:  Physical Exam  Constitutional: He  is oriented to person, place, and time. He appears well-developed and well-nourished. He is active and cooperative. No distress.  BP 132/68 (BP Location: Left Arm, Patient Position: Sitting, Cuff Size: Large)   Pulse (!) 109   Resp 14   Ht  (1.981 m)   Wt 246 lb (111.6 kg)   SpO2 98%   BMI 28.43 kg/m    Eyes: Conjunctivae are normal.  Pulmonary/Chest: Effort normal.  Musculoskeletal:       Thoracic back: He exhibits no tenderness, no bony tenderness, no swelling, no pain and no spasm.       Lumbar back: He exhibits tenderness (L>>R) and pain. He exhibits normal range of motion, no bony tenderness, no swelling, no edema, no deformity, no laceration and no spasm.  Neurological: He is alert and oriented to person, place, and time. He has normal strength. No sensory deficit.  Reflex Scores:      Patellar reflexes are 2+ on the right side and 2+ on the left side.      Achilles reflexes are 2+ on the right side and 2+ on the left side. Psychiatric: He has a normal mood and affect. His speech is normal and behavior is normal.       Dg Lumbar Spine Complete  Result Date: 05/30/2016 CLINICAL DATA:  Acute right-sided low back pain with right-sided sciatica. EXAM: LUMBAR SPINE - COMPLETE 4+ VIEW COMPARISON:  None. FINDINGS: No fracture or spondylolisthesis is  noted. Severe degenerative disc disease is noted at L4-5. Remaining disc spaces appear intact IMPRESSION: Severe degenerative disc disease is noted at L4-5. No acute abnormality seen in the lumbar spine. Electronically Signed   By: Lupita Raider, M.D.   On: 05/30/2016 14:16       Assessment & Plan:   1. Acute right-sided low back pain with right-sided sciatica INCREASE prednisone to 80 mg x 3 days, then taper down over 9 additional days. Ice. Reduce activity, but not bed rest.Change from methocarbamol to cyclobenzaprine, which has worked better for the patient previously. - DG Lumbar Spine Complete; Future - Ambulatory referral to  Orthopedic Surgery - predniSONE (DELTASONE) 20 MG tablet; Take 1.5 tabs (30 mg) with each 50 mg tab you have. THEN: Take 3 PO QAM x3days, 2 PO QAM x3days, 1 PO QAM x3days  Dispense: 22 tablet; Refill: 0 - cyclobenzaprine (FLEXERIL) 10 MG tablet; Take 1 tablet (10 mg total) by mouth 3 (three) times daily as needed for muscle spasms.  Dispense: 30 tablet; Refill: 0 - HYDROcodone-acetaminophen (NORCO) 7.5-325 MG tablet; Take 1 tablet by mouth at bedtime as needed.  Dispense: 5 tablet; Refill: 0  2. DDD (degenerative disc disease), lumbar - Ambulatory referral to Orthopedic Surgery    Return if symptoms worsen or fail to improve.   Fernande Bras, PA-C [facility]

## 2016-09-15 ENCOUNTER — Ambulatory Visit (INDEPENDENT_AMBULATORY_CARE_PROVIDER_SITE_OTHER): Payer: BLUE CROSS/BLUE SHIELD | Admitting: Internal Medicine

## 2016-09-15 ENCOUNTER — Encounter: Payer: Self-pay | Admitting: Internal Medicine

## 2016-09-15 VITALS — BP 120/76 | HR 94 | Ht 78.0 in | Wt 236.2 lb

## 2016-09-15 DIAGNOSIS — G4733 Obstructive sleep apnea (adult) (pediatric): Secondary | ICD-10-CM

## 2016-09-15 NOTE — Patient Instructions (Signed)
Order- schedule unattended home sleep test     Dx OSA  Please call us about a week after your sleep study is done so we can decide what to do.

## 2016-09-15 NOTE — Progress Notes (Signed)
09/15/16-36 year old male former smoker self referred for sleep evaluation- Self Referral; never had sleep study. Wakes up snoring and gasping for air; tired throughout the day as well. 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 Has put on considerable muscle mass in recent years. Grow friend tells him he snores loudly, he has woken himself snorting and gasping for air. Fights drowsiness while driving and understands his responsibility to drive safely. Bedtime between 11 and 12 midnight, short sleep latency, waking between 3 and 5 times at night before up at 7 AM. Epworth score 18/24 ENT surgery-septoplasty. Denies heart or lung disease.  Prior to Admission medications   Not on File   Past Medical History:  Diagnosis Date  . Deviated nasal septum 03/23/2016   Overview:  Added automatically from request for surgery [id number]  . Nasal turbinate hypertrophy 03/23/2016   Overview:  Added automatically from request for surgery [id number]  . Substance abuse    Past Surgical History:  Procedure Laterality Date  . nuss for PE     Family History  Problem Relation Age of Onset  . Heart disease Maternal Grandfather   . Cancer Paternal Grandmother    Social History   Social History  . Marital status: Single    Spouse name: n/a  . Number of children: 0  . Years of education: N/A   Occupational History  . car sales    Social History Main Topics  . Smoking status: Former Smoker    Packs/day: 1.50    Years: 1.00    Quit date: 02/2000  . Smokeless tobacco: Current User  . Alcohol use No     Comment: Quit ETOH 2.5 years ago  . 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 Sexual activity: Not on file   Other Topics Concern  . Not on file   Social History Narrative   Lives with a roommate.   ROS-see HPI   + = pos Constitutional:    weight  loss, night sweats, fevers, chills,+ fatigue, lassitude. HEENT:    headaches, difficulty swallowing, tooth/dental problems, sore throat,       sneezing, itching, ear ache, nasal congestion, post nasal drip, snoring CV:    chest pain, orthopnea, PND, swelling in lower extremities, anasarca,                                                       dizziness, palpitations Resp:   shortness of breath with exertion or at rest.                productive cough,   non-productive cough, coughing up of blood.              change in color of mucus.  wheezing.   Skin:    rash or lesions. GI:  + heartburn, indigestion, abdominal pain, nausea, vomiting, diarrhea,                 change in bowel habits, loss of appetite GU: dysuria, change in color of urine, no urgency or frequency.   flank pain. MS:   joint pain, stiffness, decreased range of motion, back pain. Neuro-  nothing unusual Psych:  change in mood or affect.  depression or anxiety.   memory loss.  OBJ- Physical Exam General- Alert, Oriented, Affect-appropriate, Distress- none acute, + tall/perimuscular Skin- rash-none, lesions- none, excoriation- none, + tattoos Lymphadenopathy- none Head- atraumatic            Eyes- Gross vision intact, PERRLA, conjunctivae and secretions clear            Ears- Hearing, canals-normal            Nose- Clear, no-Septal dev, mucus, polyps, erosion, perforation             Throat- Mallampati IV , mucosa clear , drainage- none, tonsils- atrophic Neck- flexible , trachea midline, no stridor , thyroid nl, carotid no bruit Chest - symmetrical excursion , unlabored           Heart/CV- RRR , no murmur , no gallop  , no rub, nl s1 s2                           - JVD- none , edema- none, stasis changes- none, varices- none           Lung- clear to P&A, wheeze- none, cough- none , dullness-none, rub- none           Chest wall-  Abd-  Br/ Gen/ Rectal- Not done, not indicated Extrem- cyanosis- none, clubbing, none,  atrophy- none, strength- nl Neuro- grossly intact to observation

## 2016-09-15 NOTE — Assessment & Plan Note (Signed)
Tentative diagnosis-history and physical exam consistent with diagnosis. Plan-appropriate education done concerning sleep hygiene, driving responsibility, medical concerns of OSA, diagnostic process and commonly consider treatments. He is waiting for a problem with insurance to get straightened out then will go ahead with sleep study. If positive, anticipate recommending either CPAP or an oral appliance with scheduled follow-up.

## 2016-10-04 ENCOUNTER — Telehealth: Payer: Self-pay | Admitting: Internal Medicine

## 2016-10-04 NOTE — Telephone Encounter (Signed)
lmtcb

## 2016-10-05 NOTE — Telephone Encounter (Signed)
Called pt to get insurance info

## 2016-10-05 NOTE — Telephone Encounter (Signed)
[facility]. Thanks.

## 2016-10-06 ENCOUNTER — Telehealth: Payer: Self-pay | Admitting: Internal Medicine

## 2016-10-06 NOTE — Telephone Encounter (Signed)
Spoke to [email or web address]  she is still working on this precert and will keep me informed Tobe SosSally E Ottinger

## 2016-10-06 NOTE — Telephone Encounter (Signed)
Spoke to [email or web address]  and they were able to overide the [employer]'s insurance info and pt was authorized for [employer] ZOXW#[id number] valid 10/06/16-12/04/16 Tobe SosSally E Ottinger

## 2016-10-17 DIAGNOSIS — G4733 Obstructive sleep apnea (adult) (pediatric): Secondary | ICD-10-CM | POA: Diagnosis not present

## 2016-10-18 DIAGNOSIS — G4733 Obstructive sleep apnea (adult) (pediatric): Secondary | ICD-10-CM | POA: Diagnosis not present

## 2016-10-19 ENCOUNTER — Other Ambulatory Visit: Payer: Self-pay | Admitting: *Deleted

## 2016-10-19 DIAGNOSIS — G4733 Obstructive sleep apnea (adult) (pediatric): Secondary | ICD-10-CM

## 2016-10-24 ENCOUNTER — Telehealth: Payer: Self-pay | Admitting: Internal Medicine

## 2016-10-24 DIAGNOSIS — G4733 Obstructive sleep apnea (adult) (pediatric): Secondary | ICD-10-CM

## 2016-10-24 NOTE — Telephone Encounter (Signed)
Per PCCs, you have already read this and it has been sent to be scanned.  A copy has been printed and placed on your cart.  Please advise Dr Maple Hudson on results. Thanks.

## 2016-10-24 NOTE — Telephone Encounter (Signed)
Pt returning call. Will be at [phone]

## 2016-10-24 NOTE — Telephone Encounter (Signed)
I don't see that I have gotten this study to read yet??

## 2016-10-24 NOTE — Telephone Encounter (Signed)
Pt is calling about his home sleep study results done on 8/22.  CY please advise once you have reviewed these results. Thanks

## 2016-10-24 NOTE — Telephone Encounter (Signed)
Spoke with patient. Advised that CY has not received his results yet. Advised that we will call him once we receive the results from CY.

## 2016-10-25 NOTE — Telephone Encounter (Signed)
Pt is aware of results of HST and order for CPAP placed. Pt has pending OV in 11-2016 and is aware of importance to keep appt for follow up of CPAP set up.

## 2016-10-25 NOTE — Telephone Encounter (Signed)
Unattended Home Sleep Test shows moderate obstructive sleep apnea, averaging 20 apneas per hour with some oxygen drop during these events. I recommend we order new DME, new CPAP, auto 5-20, mask of choice, humidifier, supplies, Airview dx OSA  Please make sure he has a follow-up office appointment within 31-90 days

## 2016-12-26 ENCOUNTER — Ambulatory Visit (INDEPENDENT_AMBULATORY_CARE_PROVIDER_SITE_OTHER): Payer: BLUE CROSS/BLUE SHIELD | Admitting: Internal Medicine

## 2016-12-26 ENCOUNTER — Encounter: Payer: Self-pay | Admitting: Internal Medicine

## 2016-12-26 DIAGNOSIS — J302 Other seasonal allergic rhinitis: Secondary | ICD-10-CM | POA: Diagnosis not present

## 2016-12-26 DIAGNOSIS — J3089 Other allergic rhinitis: Secondary | ICD-10-CM | POA: Diagnosis not present

## 2016-12-26 DIAGNOSIS — G4733 Obstructive sleep apnea (adult) (pediatric): Secondary | ICD-10-CM | POA: Diagnosis not present

## 2016-12-26 NOTE — Assessment & Plan Note (Signed)
We're trying to get in touch with [facility] to get download information. Airview was requested but not installed. By his description he is compliant with CPAP, comfortable with it and better off with a 10 without although he still notes some daytime drowsiness. Plan-I suggested he try to get a little more sleep to see if that helps. Occasional nap is okay. He will also try changing see Zyrtec to Claritin, which may cause a little less drowsiness.

## 2016-12-26 NOTE — Patient Instructions (Signed)
We can continue CPAP auto 5-20, mask of choice, humidifier, supplies, AirView    Dx OSA  Please call as needed 

## 2016-12-26 NOTE — Assessment & Plan Note (Signed)
Managing OTC. I suggest trying Claritin which might cause less daytime drowsiness than Zyrtec

## 2016-12-26 NOTE — Progress Notes (Signed)
HPI male former smoker followed for OSA, insomnia, complicated by degenerative disc disease...Marland Kitchen.Marland Kitchen.Marland Kitchen. competitive Pharmacist, communitybody builder Unattended Home Sleep Test 10/17/16-AHI 20.2/hour, desaturation to 84%, body weight 236 pounds OSA  -------------------------------------------------------------------------- 09/15/16-36 year old male former smoker self referred for sleep evaluation- Self Referral; never had sleep study. Wakes up snoring and gasping for air; tired throughout the day as well. 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 Has put on considerable muscle mass in recent years. Grow friend tells him he snores loudly, he has woken himself snorting and gasping for air. Fights drowsiness while driving and understands his responsibility to drive safely. Bedtime between 11 and 12 midnight, short sleep latency, waking between 3 and 5 times at night before up at 7 AM. Epworth score 18/24 ENT surgery-septoplasty. Denies heart or lung disease.  12/26/16- 36 year old male former smoker followed for OSA, insomnia, allergic rhinitis ,complicated by degenerative disc disease...Marland Kitchen.Marland Kitchen.Marland Kitchen. competitive Pharmacist, communitybody builder Unattended Home Sleep Test 10/17/16-AHI 20.2/hour, desaturation to 84%, body weight 236 pounds OSA; DME: [facility] Pt wears CPAP nightly and feels pressure working well for him after he switched to Nasal Pillows. CPAP auto 5-20/ [facility] Sleeps 8-10 hours and reports good compliance, but still waking at night and still notes drowsiness at times while driving. Download not yet available. He tells me he uses CPAP all night every night and feels better using it. His DME did not install Airview as requested. We are having trouble getting through to them on the phone now. He still occasionally uses an herbal mixture including melatonin to help with sleep. He asks if see her tach at bedtime might carry over to  contribute to daytime drowsiness. Mostly he describes feeling drowsy by 9 PM watching TV. He continues to exercise very hard and we discussed possibility this increases his sleep need. We don't know what impact his past history of alcohol and recreational drug use may be having now-ended over 3 years ago.  ROS-see HPI   + = pos Constitutional:    weight loss, night sweats, fevers, chills,+ fatigue, lassitude. HEENT:    headaches, difficulty swallowing, tooth/dental problems, sore throat,       sneezing, itching, ear ache, nasal congestion, post nasal drip, snoring CV:    chest pain, orthopnea, PND, swelling in lower extremities, anasarca,                                                       dizziness, palpitations Resp:   shortness of breath with exertion or at rest.                productive cough,   non-productive cough, coughing up of blood.              change in color of mucus.  wheezing.   Skin:    rash or lesions. GI:  + heartburn, indigestion, abdominal pain, nausea, vomiting, diarrhea,                 change in bowel habits, loss of appetite GU: dysuria, change in color of urine, no urgency or frequency.   flank pain. MS:   joint pain, stiffness, decreased range of motion, back pain. Neuro-     nothing unusual Psych:  change in mood or affect.  depression  or anxiety.   memory loss.  OBJ- Physical Exam General- Alert, Oriented, Affect-appropriate, Distress- none acute, + tall/muscular Skin- rash-none, lesions- none, excoriation- none, + tattoos Lymphadenopathy- none Head- atraumatic            Eyes- Gross vision intact, PERRLA, conjunctivae and secretions clear            Ears- Hearing, canals-normal            Nose- Clear, no-Septal dev, mucus, polyps, erosion, perforation             Throat- Mallampati IV , mucosa clear , drainage- none, tonsils- atrophic Neck- flexible , trachea midline, no stridor , thyroid nl, carotid no bruit Chest - symmetrical excursion , unlabored            Heart/CV- RRR , no murmur , no gallop  , no rub, nl s1 s2                           - JVD- none , edema- none, stasis changes- none, varices- none           Lung- clear to P&A, wheeze- none, cough- none , dullness-none, rub- none           Chest wall-  Abd-  Br/ Gen/ Rectal- Not done, not indicated Extrem- cyanosis- none, clubbing, none, atrophy- none, strength- nl Neuro- grossly intact to observation

## 2017-01-09 ENCOUNTER — Encounter: Payer: Self-pay | Admitting: Emergency Medicine

## 2017-01-09 ENCOUNTER — Other Ambulatory Visit: Payer: Self-pay

## 2017-01-09 ENCOUNTER — Ambulatory Visit (INDEPENDENT_AMBULATORY_CARE_PROVIDER_SITE_OTHER): Payer: BLUE CROSS/BLUE SHIELD | Admitting: Emergency Medicine

## 2017-01-09 VITALS — BP 130/56 | HR 85 | Temp 99.2°F | Resp 16 | Ht 78.0 in | Wt 252.8 lb

## 2017-01-09 DIAGNOSIS — G47419 Narcolepsy without cataplexy: Secondary | ICD-10-CM

## 2017-01-09 NOTE — Progress Notes (Signed)
Oscar Alexander 36 y.o.   Chief Complaint  Patient presents with  . Sleeping Problem    x 9 months    HISTORY OF PRESENT ILLNESS: This is a 36 y.o. male complaining of excessive daytime sleepiness x 9 months; states he has sleep apnea and has been using CPAP machine; states he sleeps better; states he saw his pulmonary doctor last week but did not talk about this problem; states a friend of his has similar problem and is taking a medication called modafinil that is helping greatly.  HPI   Prior to Admission medications   Not on File    Allergies  Allergen Reactions  . Skelaxin [Metaxalone] Rash    Patient Active Problem List   Diagnosis Date Noted  . Seasonal and perennial allergic rhinitis 12/26/2016  . Obstructive sleep apnea 09/15/2016  . DDD (degenerative disc disease), lumbar 05/30/2016    Past Medical History:  Diagnosis Date  . Deviated nasal septum 03/23/2016   Overview:  Added automatically from request for surgery [phone]  . Nasal turbinate hypertrophy 03/23/2016   Overview:  Added automatically from request for surgery [phone]  . Substance abuse [facility])     Past Surgical History:  Procedure Laterality Date  . nuss for PE      Social History   Socioeconomic History  . Marital status: Single    Spouse name: n/a  . Number of children: 0  . Years of education: Not on file  . Highest education level: Not on file  Social Needs  . Financial resource strain: Not on file  . Food insecurity - worry: Not on file  . Food insecurity - inability: Not on file  . Transportation needs - medical: Not on file  . Transportation needs - non-medical: Not on file  Occupational History  . Occupation: Retail banker  Tobacco Use  . Smoking status: Former Smoker    Packs/day: 1.50    Years: 1.00    Pack years: 1.50    Last attempt to quit: 02/2000    Years since quitting: 16.8  . Smokeless tobacco: Current User  Substance and Sexual Activity  . Alcohol use: No   Alcohol/week: 0.0 oz    Comment: Quit ETOH 2.5 years ago  . 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 Sexual activity: Not on file  Other Topics Concern  . Not on file  Social History Narrative   Lives with a roommate.    Family History  Problem Relation Age of Onset  . Heart disease Maternal Grandfather   . Cancer Paternal Grandmother      Review of Systems  Constitutional: Negative.  Negative for chills, fever and weight loss.  HENT: Negative.  Negative for congestion, nosebleeds and sore throat.   Eyes: Negative.  Negative for blurred vision and double vision.  Respiratory: Negative for cough.   Cardiovascular: Negative.  Negative for chest pain and palpitations.  Gastrointestinal: Negative.  Negative for abdominal pain, diarrhea, nausea and vomiting.  Genitourinary: Negative.  Negative for dysuria.  Skin: Negative.  Negative for rash.  Neurological: Negative.  Negative for dizziness and headaches.  Endo/Heme/Allergies: Negative.   All other systems reviewed and are negative.     Vitals:   01/09/17 1550  BP: (!) 130/56  Pulse: 85  Resp: 16  Temp: 99.2 F (37.3 C)  SpO2: 98%    Physical Exam  Constitutional: He is oriented to person, place, and  time. He appears well-developed and well-nourished.  HENT:  Head: Normocephalic and atraumatic.  Nose: Nose normal.  Mouth/Throat: Oropharynx is clear and moist.  Eyes: Conjunctivae and EOM are normal. Pupils are equal, round, and reactive to light.  Neck: Normal range of motion. Neck supple. No JVD present.  Cardiovascular: Regular rhythm and normal heart sounds.  Pulmonary/Chest: Effort normal and breath sounds normal.  Musculoskeletal: Normal range of motion.  Lymphadenopathy:    He has no cervical adenopathy.  Neurological: He is alert and oriented to person, place, and time. He exhibits normal muscle tone.  Skin: Skin is warm and dry. Capillary refill takes less than 2  seconds.  Psychiatric: He has a normal mood and affect. His behavior is normal.  Vitals reviewed.    ASSESSMENT & PLAN: Oscar Alexander was seen today for sleeping problem.  Diagnoses and all orders for this visit:  Primary narcolepsy without cataplexy -     Ambulatory referral to Neurology      Patient Instructions       IF you received an x-ray today, you will receive an invoice from [facility]. Please contact [facility] at [phone] with questions or concerns regarding your invoice.   IF you received labwork today, you will receive an invoice from [facility]. Please contact [facility] at [phone] with questions or concerns regarding your invoice.   Our billing staff will not be able to assist you with questions regarding bills from these companies.  You will be contacted with the lab results as soon as they are available. The fastest way to get your results is to activate your My Chart account. Instructions are located on the last page of this paperwork. If you have not heard from [place] regarding the results in 2 weeks, please contact this office.     Narcolepsy Narcolepsy is a neurological disorder that causes you to fall asleep suddenly, and without control, during the daytime (sleep attacks). Narcolepsy is a lifelong (chronic) disorder. Normally, sleep follows a regular cycle over the course of the night. After about 90 minutes of light sleep, your sleep should become deeper. When your sleep becomes deeper, your body moves less and you start dreaming. This type of deep sleep is called rapid eye movement (REM) sleep. When you have narcolepsy, your REM sleep is not well-regulated. This disrupts your sleep cycle, which causes daytime sleepiness. What are the causes? The cause of narcolepsy is not fully understood, but it may be related to:  Low levels of hypocretin, a chemical (neurotransmitter) in the brain that controls sleep and wake cycles. Hypocretin  imbalance may be caused by: ? Abnormal genes that are passed from parent to child (inherited). ? The body's defense system (immune system) attacking hypocretin brain cells (autoimmune disease).  Infection, tumor, or injury in the area of the brain that controls sleep.  Exposure to poisons (toxins), such as heavy metals, pesticides, and secondhand smoke.  What are the signs or symptoms? Symptoms of this condition include:  Excessive daytime sleepiness. This is the most common symptom and is usually the first symptom you will notice. This may affect your performance at work or school.  Sleep attacks. This means falling asleep suddenly and without control. You may fall asleep in the middle of an activity, especially low-energy activities like reading or watching TV.  Feeling like you cannot think clearly.  Trouble focusing or remembering things.  Feeling depressed.  Sudden muscle weakness (cataplexy). When this occurs, your speech may become slurred, or your knees may  buckle. Cataplexy is usually triggered by surprise, anger, fear, or laughter.  Loss of the ability to speak or move (sleep paralysis). This may occur just as you start to fall asleep or wake up. You will be aware of the paralysis. It usually lasts for just a few seconds or minutes.  Seeing, hearing, tasting, smelling, or feeling things that are not real (hallucinations). Hallucinations may occur with sleep paralysis. They can happen when you are falling asleep, waking up, or dozing.  Trouble staying asleep at night (insomnia).  Restless sleep.  How is this diagnosed? This condition may be diagnosed based on:  A physical exam to rule out any other problems that may be causing your symptoms.You may be asked to write down your sleeping patterns for several weeks in a sleep diary. This will help your health care provider make a diagnosis.  Sleep studies that measure how well your REM sleep is regulated. These tests also  measure your heart rate, breathing, movement, and brain waves. These tests include: ? An overnight sleep study (polysomnogram). ? A daytime sleep study that is done while you take several naps during the day (multiple sleep latency test, MSLT). This test measures how quickly you fall asleep and how quickly you enter REM sleep.  Removal of spinal fluid to measure hypocretin levels.  How is this treated? There is no cure for this condition, but treatment can help relieve symptoms. Treatment may include:  Lifestyle and sleeping strategies to help you cope with the condition, such as: ? Exercising regularly. ? Maintaining a regular sleep schedule. ? Avoiding caffeine and large meals before bed.  Medicines. These may include: ? Medicines that help keep you awake and alert (stimulants) to fight daytime sleepiness. ? Medicines that treat depression (antidepressants). These may be used to treat cataplexy. ? Sodium oxybate. This is a strong medicine to help you relax (sedative) that you may take at night. It can help control daytime sleepiness and cataplexy.  Follow these instructions at home: Sleeping habits  Get about 8 hours of sleep every night.  Go to sleep and get up at about the same time every day.  Keep your bedroom dark, quiet, and comfortable.  When you feel very tired, take short naps. Schedule naps so that you take them at about the same time every day.  Tell your employer or teachers that you have narcolepsy. You may be able to adjust your schedule to include time for naps.  Before bedtime: ? Avoid bright lights and screens. ? Relax. Try activities like reading or taking a warm bath. Activity  Get at least 20 minutes of exercise every day. This will help you sleep better at night and reduce daytime sleepiness.  Avoid exercising within 3 hours of bedtime.  If you are sleepy, do not drive or use heavy machinery.  If possible, take a nap before driving.  Do not swim or  go out on the water without a life jacket. Eating and drinking  Do not drink alcohol or caffeinated beverages within 4-5 hours of bedtime.  Do not eat a lot of food before bedtime. Eat meals at about the same times every day. General instructions  Take over-the-counter and prescription medicines only as told by your health care provider.  If directed, keep a sleep diary.  Tell your employer or teachers that you have narcolepsy. You may be able to adjust your schedule to include time for naps.  Do not use any products that contain nicotine or tobacco,  such as cigarettes and e-cigarettes. If you need help quitting, ask your health care provider.  Keep all follow-up visits as told by your health care provider. This is important. Contact a health care provider if:  Your symptoms are not getting better.  You have increasingly high blood pressure (hypertension).  You have changes in your heart rhythm.  You are having a hard time determining what is real and what is not (psychosis). Get help right away if:  You hurt yourself during a sleep attack or an attack of cataplexy.  You have chest pain.  You have trouble breathing. This information is not intended to replace advice given to you by your health care provider. Make sure you discuss any questions you have with your health care provider. Document Released: 02/04/2002 Document Revised: 02/08/2016 Document Reviewed: 02/08/2016 Elsevier Interactive Patient Education  2018 [employer] Inc.    Edwina BarthMiguel Claire Dolores, MD [facility]

## 2017-01-09 NOTE — Patient Instructions (Addendum)
   IF you received an x-ray today, you will receive an invoice from [facility]. Please contact [facility] at [phone] with questions or concerns regarding your invoice.   IF you received labwork today, you will receive an invoice from [facility]. Please contact [facility] at [phone] with questions or concerns regarding your invoice.   Our billing staff will not be able to assist you with questions regarding bills from these companies.  You will be contacted with the lab results as soon as they are available. The fastest way to get your results is to activate your My Chart account. Instructions are located on the last page of this paperwork. If you have not heard from us regarding the results in 2 weeks, please contact this office.     Narcolepsy Narcolepsy is a neurological disorder that causes you to fall asleep suddenly, and without control, during the daytime (sleep attacks). Narcolepsy is a lifelong (chronic) disorder. Normally, sleep follows a regular cycle over the course of the night. After about 90 minutes of light sleep, your sleep should become deeper. When your sleep becomes deeper, your body moves less and you start dreaming. This type of deep sleep is called rapid eye movement (REM) sleep. When you have narcolepsy, your REM sleep is not well-regulated. This disrupts your sleep cycle, which causes daytime sleepiness. What are the causes? The cause of narcolepsy is not fully understood, but it may be related to:  Low levels of hypocretin, a chemical (neurotransmitter) in the brain that controls sleep and wake cycles. Hypocretin imbalance may be caused by: ? Abnormal genes that are passed from parent to child (inherited). ? The body's defense system (immune system) attacking hypocretin brain cells (autoimmune disease).  Infection, tumor, or injury in the area of the brain that controls sleep.  Exposure to poisons (toxins), such as heavy metals,  pesticides, and secondhand smoke.  What are the signs or symptoms? Symptoms of this condition include:  Excessive daytime sleepiness. This is the most common symptom and is usually the first symptom you will notice. This may affect your performance at work or school.  Sleep attacks. This means falling asleep suddenly and without control. You may fall asleep in the middle of an activity, especially low-energy activities like reading or watching TV.  Feeling like you cannot think clearly.  Trouble focusing or remembering things.  Feeling depressed.  Sudden muscle weakness (cataplexy). When this occurs, your speech may become slurred, or your knees may buckle. Cataplexy is usually triggered by surprise, anger, fear, or laughter.  Loss of the ability to speak or move (sleep paralysis). This may occur just as you start to fall asleep or wake up. You will be aware of the paralysis. It usually lasts for just a few seconds or minutes.  Seeing, hearing, tasting, smelling, or feeling things that are not real (hallucinations). Hallucinations may occur with sleep paralysis. They can happen when you are falling asleep, waking up, or dozing.  Trouble staying asleep at night (insomnia).  Restless sleep.  How is this diagnosed? This condition may be diagnosed based on:  A physical exam to rule out any other problems that may be causing your symptoms.You may be asked to write down your sleeping patterns for several weeks in a sleep diary. This will help your health care provider make a diagnosis.  Sleep studies that measure how well your REM sleep is regulated. These tests also measure your heart rate, breathing, movement, and brain waves. These tests include: ?   An overnight sleep study (polysomnogram). ? A daytime sleep study that is done while you take several naps during the day (multiple sleep latency test, MSLT). This test measures how quickly you fall asleep and how quickly you enter REM  sleep.  Removal of spinal fluid to measure hypocretin levels.  How is this treated? There is no cure for this condition, but treatment can help relieve symptoms. Treatment may include:  Lifestyle and sleeping strategies to help you cope with the condition, such as: ? Exercising regularly. ? Maintaining a regular sleep schedule. ? Avoiding caffeine and large meals before bed.  Medicines. These may include: ? Medicines that help keep you awake and alert (stimulants) to fight daytime sleepiness. ? Medicines that treat depression (antidepressants). These may be used to treat cataplexy. ? Sodium oxybate. This is a strong medicine to help you relax (sedative) that you may take at night. It can help control daytime sleepiness and cataplexy.  Follow these instructions at home: Sleeping habits  Get about 8 hours of sleep every night.  Go to sleep and get up at about the same time every day.  Keep your bedroom dark, quiet, and comfortable.  When you feel very tired, take short naps. Schedule naps so that you take them at about the same time every day.  Tell your employer or teachers that you have narcolepsy. You may be able to adjust your schedule to include time for naps.  Before bedtime: ? Avoid bright lights and screens. ? Relax. Try activities like reading or taking a warm bath. Activity  Get at least 20 minutes of exercise every day. This will help you sleep better at night and reduce daytime sleepiness.  Avoid exercising within 3 hours of bedtime.  If you are sleepy, do not drive or use heavy machinery.  If possible, take a nap before driving.  Do not swim or go out on the water without a life jacket. Eating and drinking  Do not drink alcohol or caffeinated beverages within 4-5 hours of bedtime.  Do not eat a lot of food before bedtime. Eat meals at about the same times every day. General instructions  Take over-the-counter and prescription medicines only as told by  your health care provider.  If directed, keep a sleep diary.  Tell your employer or teachers that you have narcolepsy. You may be able to adjust your schedule to include time for naps.  Do not use any products that contain nicotine or tobacco, such as cigarettes and e-cigarettes. If you need help quitting, ask your health care provider.  Keep all follow-up visits as told by your health care provider. This is important. Contact a health care provider if:  Your symptoms are not getting better.  You have increasingly high blood pressure (hypertension).  You have changes in your heart rhythm.  You are having a hard time determining what is real and what is not (psychosis). Get help right away if:  You hurt yourself during a sleep attack or an attack of cataplexy.  You have chest pain.  You have trouble breathing. This information is not intended to replace advice given to you by your health care provider. Make sure you discuss any questions you have with your health care provider. Document Released: 02/04/2002 Document Revised: 02/08/2016 Document Reviewed: 02/08/2016 Elsevier Interactive Patient Education  2018 [employer]  

## 2017-01-16 ENCOUNTER — Encounter: Payer: Self-pay | Admitting: Neurology

## 2017-01-16 ENCOUNTER — Ambulatory Visit (INDEPENDENT_AMBULATORY_CARE_PROVIDER_SITE_OTHER): Payer: BLUE CROSS/BLUE SHIELD | Admitting: Neurology

## 2017-01-16 VITALS — BP 130/67 | HR 80 | Ht 78.0 in | Wt 258.0 lb

## 2017-01-16 DIAGNOSIS — G4733 Obstructive sleep apnea (adult) (pediatric): Secondary | ICD-10-CM | POA: Diagnosis not present

## 2017-01-16 DIAGNOSIS — F1911 Other psychoactive substance abuse, in remission: Secondary | ICD-10-CM

## 2017-01-16 DIAGNOSIS — Z87898 Personal history of other specified conditions: Secondary | ICD-10-CM

## 2017-01-16 DIAGNOSIS — E663 Overweight: Secondary | ICD-10-CM

## 2017-01-16 DIAGNOSIS — Z9989 Dependence on other enabling machines and devices: Secondary | ICD-10-CM

## 2017-01-16 DIAGNOSIS — G4710 Hypersomnia, unspecified: Secondary | ICD-10-CM | POA: Diagnosis not present
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
## 2017-01-16 NOTE — Patient Instructions (Addendum)
I believe you may have an underlying sleepiness condition in addition to obstructive sleep apnea. This means, that you have a sleep disorder that manifests with excessive sleep and excessive sleepiness during the day.   We will do additional testing at this point. I would like for you to come back for an overnight sleep study during which you will use your autoPAP and we will monitor your night time sleep and how well your sleep apnea is treated on the current settings. We will then do nap study testing the next day: 5 scheduled 20 min nap opportunities, every 2 hours. We will remind you to stay awake in between naps.   As explained, you will have to be stable on your typical routine for sleep and caffeine. You cannot be on a stimulant or antidepressant medication in preparation for the sleep studies.

## 2017-02-13 ENCOUNTER — Institutional Professional Consult (permissible substitution): Payer: BLUE CROSS/BLUE SHIELD | Admitting: Neurology

## 2017-02-15 ENCOUNTER — Ambulatory Visit (INDEPENDENT_AMBULATORY_CARE_PROVIDER_SITE_OTHER): Payer: BLUE CROSS/BLUE SHIELD | Admitting: Neurology

## 2017-02-15 DIAGNOSIS — G47419 Narcolepsy without cataplexy: Secondary | ICD-10-CM

## 2017-02-15 DIAGNOSIS — E663 Overweight: Secondary | ICD-10-CM

## 2017-02-15 DIAGNOSIS — F1911 Other psychoactive substance abuse, in remission: Secondary | ICD-10-CM

## 2017-02-15 DIAGNOSIS — G4733 Obstructive sleep apnea (adult) (pediatric): Secondary | ICD-10-CM

## 2017-02-15 DIAGNOSIS — Z9989 Dependence on other enabling machines and devices: Secondary | ICD-10-CM

## 2017-02-15 DIAGNOSIS — G4710 Hypersomnia, unspecified: Secondary | ICD-10-CM

## 2017-02-16 ENCOUNTER — Ambulatory Visit (INDEPENDENT_AMBULATORY_CARE_PROVIDER_SITE_OTHER): Payer: BLUE CROSS/BLUE SHIELD | Admitting: Neurology

## 2017-02-16 DIAGNOSIS — F1911 Other psychoactive substance abuse, in remission: Secondary | ICD-10-CM

## 2017-02-16 DIAGNOSIS — Z9989 Dependence on other enabling machines and devices: Secondary | ICD-10-CM

## 2017-02-16 DIAGNOSIS — G4710 Hypersomnia, unspecified: Secondary | ICD-10-CM

## 2017-02-16 DIAGNOSIS — G47419 Narcolepsy without cataplexy: Secondary | ICD-10-CM

## 2017-02-16 DIAGNOSIS — E663 Overweight: Secondary | ICD-10-CM

## 2017-02-16 DIAGNOSIS — G4733 Obstructive sleep apnea (adult) (pediatric): Secondary | ICD-10-CM

## 2017-02-16 NOTE — Addendum Note (Signed)
Addended by: Geronimo RunningINKINS, Syliva Mee A on: 02/16/2017 02:57 PM   Modules accepted: Orders

## 2017-02-23 LAB — COMPREHENSIVE DRUG ANALYSIS,UR

## 2017-03-02 ENCOUNTER — Telehealth: Payer: Self-pay

## 2017-03-02 NOTE — Progress Notes (Signed)
Patient referred by Dr. Alvy BimlerSagardia, seen by me on 01/16/17, he had PSG with autoPAP and next day MSLT on 12/19 and 12/20, respectively.   Please call and notify the patient that the overnight study demonstrates adequate treatment of the patient's sleep apnea with his current autoPAP. He can continue using autoPAP at night.  The next day MSLT/nap study demonstrates a significantly low mean sleep latency (1.8 min) and 1 SOREMPs (sleep onset REM period). 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 Treatment options should be discussed; pls offer FU soon to the patient.   Thanks,  Huston FoleySaima Dachelle Molzahn, MD, PhD [facility])

## 2017-03-02 NOTE — Procedures (Signed)
Name:  Oscar Alexander, [place]que[place] Reference [id number]  Study Date: 02/16/2017 Procedure #: [id number]  DOB: 24-Mar-1980    HISTORY: 37 year old man with a history of allergies, prior history of substance abuse, overweight state, and recent diagnosis of sleep apnea for which he is on autoPAP at home, who reports severe sleepiness, despite compliance with PAP treatment. He presents for evaluation for an underlying hypersomnolence disorder. The patient endorsed the Epworth Sleepiness Scale at 22/24. The patient's weight 258 pounds with a height of 78 (inches), resulting in a BMI of 29.8 kg/m2. The patient's neck circumference measured 17.9 inches. A UDS was performed the next day, in between his MSLT naps and was negative.  Protocol  This is a 13 channel Multiple Sleep Latency Test comprised of 5 channels of EEG (T3-Cz, Cz-T4, F4-M1, C4-M1, O2-M1), 3 channels of Chin EMG, 4 channels of EOG and 1 channel for ECG.   All channels were sampled at 256hz .    This polysomnographic procedure is designed to evaluate (1) the complaint of excessive daytime sleepiness by quantifying the time required to fall asleep and (2) the possibility of narcolepsy by checking for abnormally short latencies to REM sleep.  Electrographic variables include EEG, EMG, EOG and ECG.  Patients are monitored throughout four or five 20-minute opportunities to sleep (naps) at two-hour intervals.  For each nap, the patient is allowed 20 minutes to fall asleep.  Once asleep, the patient is awakened after 15 minutes.  Between naps, the patient is kept as alert as possible.  A sleep latency of 20 minutes indicates that no sleep occurred.  Parametric Analysis  Total Number of Naps 5     NAP # Time of Nap  Sleep Latency (mins) REM Latency (mins) Sleep Time Percent Awake Time Percent  1 07:26 2.5 0 86 14   2 09:15 1 13  89  11   3 11:16 1.5 0 84  16   4 13:17 1 0 94  6   5 15:08 3 0 84  16    MSLT Summary of Naps  Sleepiness Index: 91  Mean  Sleep Latency to all Five Naps: 1.8  Mean Sleep Latency to First Four Naps: 1.5  Mean Sleep Latency to First Three Naps: 1.7  Mean Sleep Latency to First Two Naps: 1.8  Number of Naps with REM Sleep: 1    Results from Preceding PSG Study  Sleep Onset Time  Sleep Efficiency (%) 89.7  Rise Time 05:42 Sleep Latency (min) 1.5  Total Sleep Time  367.5 REM Latency (min) 64.5    I attest to having reviewed every epoch of the entire raw data recording prior to the issuance of this report in accordance with the Standards of the American Academy of Sleep Medicine.    IMPRESSION:  1. This multiple sleep latency test reveals a mean sleep latency of 1.8 minutes with one sleep periods during which REM sleep was recorded.  A total of 5 sleep periods were recorded. This study was preceded by an overnight polysomnogram with a total sleep time (TST) of 367.5 minutes.       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 OSA     PLANS/RECOMMENDATIONS:  1. This study demonstrates adequate treatment of the patient's sleep apnea with his current autoPAP. The patient will be advised to continue using autoPAP at night.  2. This study in conjunction with the next day MSLT/nap study  demonstrate a high normal percentage of REM sleep and mildly reduced REM latency for the overnight study and significantly low mean sleep latency (1.8 min) and 1 SOREMPs (sleep onset REM periods) for the nap study. 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 Treatment options will be discussed with the patient.  3. The patient should be cautioned not to drive, work at heights, or operate dangerous or heavy equipment when tired or sleepy. Review and reiteration of good sleep hygiene measures should be pursued with  any patient. 4. The patient will be [facility] discussion of the test results and further management strategies. The referring provider will be notified of the test results.  I certify that I have reviewed the entire raw data recording prior to the issuance of this report in accordance with the Standards of Accreditation of the American Academy of Sleep Medicine (AASM)   Huston FoleySaima Vannie Hochstetler, MD, PhD Diplomat, American Board of Psychiatry and Neurology (Neurology and Sleep Medicine)

## 2017-03-02 NOTE — Telephone Encounter (Signed)
-----   Message from Huston FoleySaima Athar, MD sent at 03/02/2017  8:05 AM EST ----- Patient referred by Dr. Alvy BimlerSagardia, seen by me on 01/16/17, he had PSG with autoPAP and next day MSLT on 12/19 and 12/20, respectively.   Please call and notify the patient that the overnight study demonstrates adequate treatment of the patient's sleep apnea with his current autoPAP. He can continue using autoPAP at night.  The next day MSLT/nap study demonstrates a significantly low mean sleep latency (1.8 min) and 1 SOREMPs (sleep onset REM period). 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 Treatment options should be discussed; pls offer FU soon to the patient.   Thanks,  Huston FoleySaima Athar, MD, PhD [facility]

## 2017-03-02 NOTE — Progress Notes (Signed)
See result note for PSG. sa

## 2017-03-02 NOTE — Telephone Encounter (Addendum)
I called pt. I advised him that his sleep study results show that he is adequately treated with auto pap with regards to his osa. His MSLT demonstated a significantly low sleep latency with 1 SOREMP. 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 Pt reports that he does not want the diagnosis of narcolepsy. Pt is agreeable to a follow up on 03/13/17 with Dr. Frances FurbishAthar to discuss these results and treatment options. Pt verbalized understanding of results. Pt had no questions at this time but was encouraged to call back if questions arise.

## 2017-03-02 NOTE — Procedures (Signed)
PATIENT'S NAME:  Oscar Alexander, Oscar Alexander DOB:      03/20/1980      MR#:    [id number]     DATE OF RECORDING: 02/15/2017 REFERRING M.D.:  Edwina BarthMiguel Sagardia, MD Study Performed:   CPAP  Titration HISTORY: 37 year old man with a history of allergies, prior history of substance abuse, overweight state, and recent diagnosis of sleep apnea for which he is on autoPAP at home, who reports severe sleepiness, despite compliance with PAP treatment. He presents for evaluation for an underlying hypersomnolence disorder. The patient endorsed the Epworth Sleepiness Scale at 22/24. The patient's weight 258 pounds with a height of 78 (inches), resulting in a BMI of 29.8 kg/m2. The patient's neck circumference measured 17.9 inches. A UDS was performed the next day, in between his MSLT naps and was negative.  CURRENT MEDICATIONS: None   PROCEDURE:  This is a multichannel digital polysomnogram utilizing the SomnoStar 11.2 system.  Electrodes and sensors were applied and monitored per AASM Specifications.   EEG, EOG, Chin and Limb EMG, were sampled at 200 Hz.  ECG, Snore and Nasal Pressure, Thermal Airflow, Respiratory Effort, CPAP Flow and Pressure, Oximetry was sampled at 50 Hz. Digital video and audio were recorded.      The patient was kept on autoPAP (5-20 cm) during the night and used his own mask. AHI was 0/hour.   Lights Out was at 22:53 and Lights On at 05:42. Total recording time (TRT) was 409.5 minutes, with a total sleep time (TST) of 367.5 minutes. The patient's sleep latency was 1.5 minutes. REM latency was 64.5 minutes, which is mildly reduced. The sleep efficiency was 89.7 %.    SLEEP ARCHITECTURE: WASO (Wake after sleep onset) was 40.5 minutes with mild sleep fragmentation noted. There were 17 minutes in Stage N1, 255.5 minutes Stage N2, 0 minutes Stage N3 and 95 minutes in Stage REM.  The percentage of Stage N1 was 4.6%, Stage N2 was 69.5%, which is increased, Stage N3 was absent, and Stage R (REM sleep) was 25.9%,  which is high normal. The arousals were noted as: 45 were spontaneous, 0 were associated with PLMs, 0 were associated with respiratory events.  Audio and video analysis did not show any abnormal or unusual movements, behaviors, phonations or vocalizations. The patient took 1 bathroom break. No significant snoring was noted. EKG was in keeping with normal sinus rhythm (NSR).  RESPIRATORY ANALYSIS:  There was a total of 0 respiratory events: 0 obstructive apneas, 0 central apneas and 0 mixed apneas with a total of 0 apneas and an apnea index (AI) of 0 /hour. There were 0 hypopneas with a hypopnea index of 0/hour. The patient also had 0 respiratory event related arousals (RERAs).      The total APNEA/HYPOPNEA INDEX  (AHI) was 0 /hour and the total RESPIRATORY DISTURBANCE INDEX was 0 .hour  0 events occurred in REM sleep and 0 events in NREM. The REM AHI was 0 /hour versus a non-REM AHI of 0 /hour.  The patient spent 277 minutes of total sleep time in the supine position and 91 minutes in non-supine. The supine AHI was 0.0, versus a non-supine AHI of 0.0.  OXYGEN SATURATION & C02:  The baseline 02 saturation was 94%, with the lowest being 90%. Time spent below 89% saturation equaled 0 minutes.  PERIODIC LIMB MOVEMENTS: The patient had a total of 0 Periodic Limb Movements. The Periodic Limb Movement (PLM) index was 0 and the PLM Arousal index was 0 /hour.  Post-study, the  patient indicated that sleep was worse than usual.   The patient had a next day MSLT on 02/16/17. He had 5 nap opportunities and achieved sleep in all 5 naps with a mean sleep latency of 1.8, indicating a pathologically high degree of sleepiness. One REM onset nap was noted, during nap #2.   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 OSA     PLANS/RECOMMENDATIONS:  1. This study demonstrates adequate treatment of the patient's sleep apnea with his current autoPAP. The patient will be advised to continue using autoPAP at night.   2. This study in conjunction with the next day MSLT/nap study demonstrate a high normal percentage of REM sleep and mildly reduced REM latency for the overnight study and significantly low mean sleep latency (1.8 min) and 1 SOREMPs (sleep onset REM periods) for the nap study. 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 Treatment options will be discussed with the patient.  3. The patient should be cautioned not to drive, work at heights, or operate dangerous or heavy equipment when tired or sleepy. Review and reiteration of good sleep hygiene measures should be pursued with any patient. 4. The patient will be seen in follow-up by Dr. Frances Furbish at [facility] for discussion of the test results and further management strategies. The referring provider will be notified of the test results.  I certify that I have reviewed the entire raw data recording prior to the issuance of this report in accordance with the Standards of Accreditation of the American Academy of Sleep Medicine (AASM)   Huston Foley, MD, PhD Diplomat, American Board of Psychiatry and Neurology (Neurology and Sleep Medicine)

## 2017-03-12 ENCOUNTER — Telehealth: Payer: Self-pay

## 2017-03-12 NOTE — Telephone Encounter (Signed)
I called pt. No answer, left a detailed message on his cell phone, per DPR, advising him that the office is closed until 10:00a on 03/03/17 and his appt will need to be rescheduled. I will call pt this week to get him rescheduled.

## 2017-03-13 ENCOUNTER — Ambulatory Visit: Payer: Self-pay | Admitting: Neurology

## 2017-03-13 ENCOUNTER — Encounter: Payer: Self-pay | Admitting: Neurology

## 2017-03-13 NOTE — Telephone Encounter (Signed)
I called pt, he is agreeable to coming in for an appt on 03/14/17 regarding his sleep study results instead. Pt verbalized understanding of new appt date and time.

## 2017-03-14 ENCOUNTER — Encounter: Payer: Self-pay | Admitting: Neurology

## 2017-03-14 ENCOUNTER — Ambulatory Visit: Payer: BLUE CROSS/BLUE SHIELD | Admitting: Neurology

## 2017-03-14 VITALS — BP 154/87 | HR 83 | Ht 78.0 in | Wt 261.0 lb

## 2017-03-14 DIAGNOSIS — G4733 Obstructive sleep apnea (adult) (pediatric): Secondary | ICD-10-CM | POA: Diagnosis not present

## 2017-03-14 DIAGNOSIS — Z9989 Dependence on other enabling machines and devices: Secondary | ICD-10-CM | POA: Diagnosis not present

## 2017-03-14 DIAGNOSIS — G47419 Narcolepsy without cataplexy: Secondary | ICD-10-CM

## 2017-03-14 MED ORDER — MODAFINIL 100 MG PO TABS: ORAL_TABLET | ORAL | 5 refills | Status: DC

## 2017-03-14 NOTE — Patient Instructions (Addendum)
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 We may have to try different medications that may help you stay awake during the day. Not everything works with everybody the same way. Wake promoting agents include stimulants and non-stimulant type medications. The most common side effects with stimulants are weight loss, insomnia, nervousness, headaches, palpitations, rise in blood pressure, anxiety. Stimulants can be addictive and subject to abuse. Non-stimulant type wake promoting medications include Provigil and Nuvigil, most common side effects include headaches, nervousness, insomnia, hypertension. In addition there is a medication called Xyrem which has been proven to be very effective in patients with narcolepsy with or without cataplexy. Some patients with narcolepsy report episodes of weakness, such as jaw or facial weakness, legs giving out, feeling wobbly or like "Jell-o", etc. in situations of anxiety, stress, laughter, sudden sadness, surprise, etc., which is called cataplexy. You can also experience episodes of sleep paralysis during which you may feel unable to move upon awakening. Some people experience dreamlike sequences upon awakening or upon drifting off to sleep, called hypnopompic or hypnagogic hallucinations.   As discussed, we will start Provigil 100 mg: 1 pill up to 2 times a day as needed. Avoid after 3 PM. Side effects include (but are not limited to): high blood pressure, headache, nervousness, palpitations, GI upset, tremor.   As discussed, we may consider down the road a medication Xyrem, which is approved for patients with narcolepsy, but has some important warnings. Xyrem can cause increase in depression and even suicidal thoughts or actions. Keep your counselor informed and we may need to involve a psychiatrist if  needed. You must not take ANY illicit drugs at this time, this includes Marijuana, and of course cocaine. We will have to screen you for illicit drugs. We will NOT start Xyrem if your drug screen comes back positive for any illicit drugs. We will not start Xyrem quite yet. We will give you info to read up on it.   Please call Dr. Roxy CedarYoung's office for a possible change in your autoPAP pressure range, to narrow it to something like 8 cm to 16 cm. You have done a great job using your autoPAP, keep up the good work!
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
## 2017-03-16 ENCOUNTER — Telehealth: Payer: Self-pay | Admitting: Neurology

## 2017-03-16 NOTE — Telephone Encounter (Signed)
Pt said modafinil (PROVIGIL) 100 MG tablet needs PA per [facility].

## 2017-03-16 NOTE — Telephone Encounter (Signed)
[facility] Should have a determination in 1-3 business days.

## 2017-03-21 MED ORDER — ARMODAFINIL 150 MG PO TABS: 150.0000 mg | ORAL_TABLET | Freq: Every day | ORAL | 1 refills | Status: DC

## 2017-03-21 NOTE — Telephone Encounter (Signed)
Pt's modafinil was denied by his insurance because he has not tried generic armodafinil.  Received a VO from Dr. Frances FurbishAthar to try pt on armodafinil 150mg  daily and ask pt to call in 30 days and let us know how he is doing.  I called pt. I advised him of this change. Pt is agreeable to this and will call us with an update in 30 days. [facility] [address] Received a receipt of confirmation.

## 2017-03-21 NOTE — Addendum Note (Signed)
Addended by: Geronimo RunningINKINS, Azrael Huss A on: 03/21/2017 11:51 AM   Modules accepted: Orders

## 2017-03-22 ENCOUNTER — Telehealth: Payer: Self-pay | Admitting: Neurology

## 2017-03-22 NOTE — Telephone Encounter (Signed)
I called pt. His [facility] [address] is out of armodafinil currently. [facility] [address] [facility]. RX for armodafinil faxed. Received a receipt of confirmation.

## 2017-03-22 NOTE — Telephone Encounter (Signed)
Pt has called to inform that the normal [facility] he uses does not have Armodafinil 150 MG tablet.  Pt has confirmed that the [facility] [address] @ [phone] has it, please send the prescription there, no call back requested

## 2017-04-26 ENCOUNTER — Encounter: Payer: Self-pay | Admitting: Neurology

## 2017-04-27 ENCOUNTER — Encounter: Payer: Self-pay | Admitting: Neurology

## 2017-04-27 ENCOUNTER — Ambulatory Visit: Payer: BLUE CROSS/BLUE SHIELD | Admitting: Neurology

## 2017-04-27 VITALS — BP 160/87 | HR 90 | Ht 78.0 in | Wt 269.0 lb

## 2017-04-27 DIAGNOSIS — G47419 Narcolepsy without cataplexy: Secondary | ICD-10-CM | POA: Diagnosis not present

## 2017-04-27 DIAGNOSIS — G4733 Obstructive sleep apnea (adult) (pediatric): Secondary | ICD-10-CM | POA: Diagnosis not present

## 2017-04-27 DIAGNOSIS — Z9989 Dependence on other enabling machines and devices: Secondary | ICD-10-CM

## 2017-04-27 MED ORDER — MODAFINIL 100 MG PO TABS: 100.0000 mg | ORAL_TABLET | Freq: Every day | ORAL | 5 refills | Status: DC

## 2017-04-27 MED ORDER — MODAFINIL 100 MG PO TABS: 100.0000 mg | ORAL_TABLET | Freq: Two times a day (BID) | ORAL | 5 refills | Status: DC
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
## 2017-04-27 NOTE — Patient Instructions (Signed)
You had side effects with the generic Nuvigil at 150 mg daily.  We will try you on Provigil 100 mg: 1 pill up to 2 times a day (may titrate, the way you need it). Avoid after 3 PM. Side effects include (but are not limited to): high blood pressure, headache, nervousness, palpitations, GI upset, tremor.

## 2017-04-28 ENCOUNTER — Telehealth: Payer: Self-pay | Admitting: Neurology

## 2017-04-28 NOTE — Telephone Encounter (Signed)
Called pt back. Advised it looked like Dr. Frances FurbishAthar gave him new rx for modafinil 100mg  twice daily yesterday. He states they only gave him rx for daily. He is going to contact pharmacy and see what happened. He will call back if he has further questions/concerns.

## 2017-04-28 NOTE — Telephone Encounter (Signed)
Patient returned call and stated that the prescription the pharmacy has is Modafinil 100 mg Daily. Pt will need new prescription written for BID.   Located successfully faxed prescription for Modafinil 100 mg BID. Called pt's pharmacy [facility] and spoke with pharmacist. She verbalized receipt for prescription yesterday however stated that the sig is Take 1 tablet QD. She stated that the patient had already picked up the prescription. It was for 60 tablets. Faxed prescription, received receipt of confirmation.   Spoke with patient. He stated he received 30 tablets. He will go to the pharmacy and see if he can receive the other 30.

## 2017-04-28 NOTE — Telephone Encounter (Signed)
Pt states he was told he is to take this medication modafinil (PROVIGIL) 100 MG tablet twice a day.  His prescription bottle states once a day.  Pt is asking to be called re: this being corrected

## 2017-05-03 NOTE — Addendum Note (Signed)
Addended by: Geronimo RunningINKINS, Eriyanna Kofoed A on: 05/03/2017 11:51 AM   Modules accepted: Orders

## 2017-05-03 NOTE — Telephone Encounter (Signed)
Received pa for modafinil 100mg  BID. [facility] Should have a determination in 1-3 business days.

## 2017-05-09 NOTE — Telephone Encounter (Signed)
FYI Pt returned call to RN Baxter HireKristen, he was made aware of approval and has no questions. No call back requested

## 2017-05-09 NOTE — Telephone Encounter (Signed)
Received notice that pt's modafinil 100 mg BID dosing was approved by [employer] from 03/16/17 to 03/14/2020. I called pt to advise him, no answer, left a message asking him to call me back.  If pt calls back, please advise him of this.  [facility] notified of approval.

## 2017-06-14 ENCOUNTER — Ambulatory Visit: Payer: BLUE CROSS/BLUE SHIELD | Admitting: Neurology

## 2017-08-28 ENCOUNTER — Ambulatory Visit: Payer: BLUE CROSS/BLUE SHIELD | Admitting: Neurology

## 2017-10-29 ENCOUNTER — Other Ambulatory Visit: Payer: Self-pay | Admitting: Neurology

## 2017-11-08 ENCOUNTER — Encounter: Payer: Self-pay | Admitting: Neurology

## 2017-11-13 ENCOUNTER — Encounter: Payer: Self-pay | Admitting: Neurology

## 2017-11-13 ENCOUNTER — Ambulatory Visit: Payer: BLUE CROSS/BLUE SHIELD | Admitting: Neurology

## 2017-11-13 VITALS — BP 144/78 | HR 90 | Ht 78.0 in | Wt 254.0 lb

## 2017-11-13 DIAGNOSIS — G47419 Narcolepsy without cataplexy: Secondary | ICD-10-CM | POA: Diagnosis not present

## 2017-11-13 DIAGNOSIS — G4733 Obstructive sleep apnea (adult) (pediatric): Secondary | ICD-10-CM | POA: Diagnosis not present

## 2017-11-13 DIAGNOSIS — Z9989 Dependence on other enabling machines and devices: Secondary | ICD-10-CM | POA: Diagnosis not present
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
## 2017-11-13 NOTE — Patient Instructions (Addendum)
I would be happy to manage your sleep apnea and autoPAP therapy.  Please continue using your autoPAP regularly. While your insurance requires that you use PAP at least 4 hours each night on 70% of the nights, I recommend, that you not skip any nights and use it throughout the night if you can. Getting used to PAP and staying with the treatment long term does take time and patience and discipline. Untreated obstructive sleep apnea when it is moderate to severe can have an adverse impact on cardiovascular health and raise her risk for heart disease, arrhythmias, hypertension, congestive heart failure, stroke and diabetes. Untreated obstructive sleep apnea causes sleep disruption, nonrestorative sleep, and sleep deprivation. This can have an impact on your day to day functioning and cause daytime sleepiness and impairment of cognitive function, memory loss, mood disturbance, and problems focussing. Using PAP regularly can improve these symptoms.  We will continue with the generic Provigil at the current dose. Please avoid caffeine after 5 PM. Latest time for provigil dose 5 PM.   You have continued to do well on autoPAP, please be sure to change your filter every 1-2 months, your mask about every 3 months, hose about 6 months, humidifier chamber about yearly. Some restrictions are imposed by her insurance carrier with regard to how frequently you can get certain supplies.   I will change your autoPAP to 7 cm to max of 15 cm. I will renew your autoPAP supplies and you may be able to switch to a nasal mask.

## 2017-12-27 ENCOUNTER — Ambulatory Visit: Payer: BLUE CROSS/BLUE SHIELD | Admitting: Internal Medicine

## 2018-02-06 ENCOUNTER — Telehealth: Payer: Self-pay | Admitting: Neurology

## 2018-02-06 DIAGNOSIS — Z9989 Dependence on other enabling machines and devices: Principal | ICD-10-CM

## 2018-02-06 DIAGNOSIS — G4733 Obstructive sleep apnea (adult) (pediatric): Secondary | ICD-10-CM

## 2018-02-06 NOTE — Telephone Encounter (Signed)
Received VO from Dr. Frances FurbishAthar for pt's travel cpap. I called pt. He would like to pick up this RX; he will buy it from [email or web address]. He understands that insurance is not likely to pay for this machine. I will have the RX ready for pick up at the front desk by lunch today. Pt verbalized understanding.

## 2018-02-06 NOTE — Telephone Encounter (Signed)
Pt requesting a call stating he would like an rx for a travel CPAP if possible, please call to advise.

## 2018-03-08 ENCOUNTER — Telehealth: Payer: Self-pay | Admitting: Neurology

## 2018-03-08 NOTE — Telephone Encounter (Signed)
Pt returned my call. He reports that he has a 30 day trial of the Breas Z2 travel cpap set at 7-15 cm H2O. He says that the pressure rarely goes about 7 cm and he feels hat he doesn't get enough pressure. He has worked with the RT at [facility] [employer] [facility] with it and even tried several new masks with it because the leak was high. He wants to know if there are any other brands of travel cpaps that we recommend. He says that he found that [employer]Med and [employer] both make a travel cpap. He needs to know before 03/11/18 before his travel cpap 30 day trial expires.

## 2018-03-08 NOTE — Telephone Encounter (Signed)
He really needs to work with his DME company about trying the travel CPAP. I know that [employer]Med and Respironics make travel CPAP units, both are usually good. No other recs.

## 2018-03-08 NOTE — Telephone Encounter (Signed)
Pt states re:his travel CPAP the air pressure is not satisfactory.  Pt would like a call from RN to see if Dr Frances Furbish would recommend another model or brand.  Please call

## 2018-03-08 NOTE — Telephone Encounter (Signed)
I called pt and advised him of Dr. Teofilo Pod recommendations. Pt will keep following up with [facility] regarding his travel cpap. Pt verbalized understanding of recommendations.

## 2018-03-08 NOTE — Telephone Encounter (Signed)
I called pt to discuss. No answer, left a message asking him to call me back. 

## 2018-05-01 ENCOUNTER — Other Ambulatory Visit: Payer: Self-pay | Admitting: Neurology

## 2018-05-01 MED ORDER — MODAFINIL 100 MG PO TABS: 100.0000 mg | ORAL_TABLET | Freq: Two times a day (BID) | ORAL | 3 refills | Status: DC

## 2018-05-01 NOTE — Telephone Encounter (Signed)
I called pt. He is changing insurances and his insurance is only accepted at [facility] now. He is in the process of getting established there. He is asking for a refill on his modafinil while he gets established.  Pt is due for a refill on his modafinil. [place] [facility] checked.

## 2018-05-01 NOTE — Addendum Note (Signed)
Addended by: Geronimo Running A on: 05/01/2018 11:27 AM   Modules accepted: Orders

## 2018-05-01 NOTE — Telephone Encounter (Signed)
Pt has called to inform RN Baxter Hire that he is having to change doctors and wants a call from her to discuss his medications

## 2018-05-01 NOTE — Telephone Encounter (Signed)
Pt request refill for modafinil (PROVIGIL) 100 MG tablet sent to [facility]. Pt also states his insurance has changed to [facility]. He is thinking he will need to change to [facility].  FYI

## 2018-05-15 ENCOUNTER — Ambulatory Visit: Payer: BLUE CROSS/BLUE SHIELD | Admitting: Neurology

## 2018-09-17 ENCOUNTER — Other Ambulatory Visit: Payer: Self-pay | Admitting: Neurology

## 2018-09-17 MED ORDER — MODAFINIL 100 MG PO TABS: 100.0000 mg | ORAL_TABLET | Freq: Two times a day (BID) | ORAL | 3 refills | Status: AC

## 2018-09-17 NOTE — Telephone Encounter (Signed)
Pt has called for a refill on his modafinil (PROVIGIL) 100 MG tablet [place] [phone]  for 1 to 2 months because he is having to wait about 2 months before he can be seen at Neurologic practice in [facility]

## 2018-09-17 NOTE — Telephone Encounter (Signed)
Pt is due for a refill on modafinil. [place] [facility] Registry checked.

## 2018-09-17 NOTE — Addendum Note (Signed)
Addended by: Lester [place] A on: 09/17/2018 02:17 PM   Modules accepted: Orders

## 2018-10-07 IMAGING — DX DG LUMBAR SPINE COMPLETE 4+V
5 series · 5 of 5 positions shown · non-contrast
Comparison: None.

CLINICAL DATA: Acute right-sided low back pain with right-sided
sciatica.

EXAM:
LUMBAR SPINE - COMPLETE 4+ VIEW

[l-spine ap]
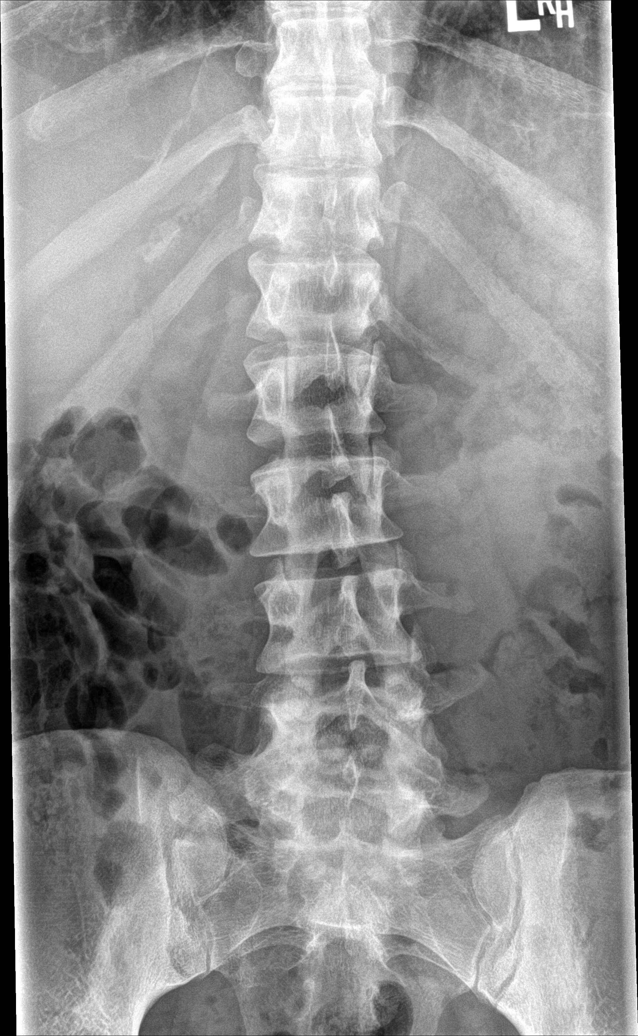

[l-spine obl (1 of 2)]
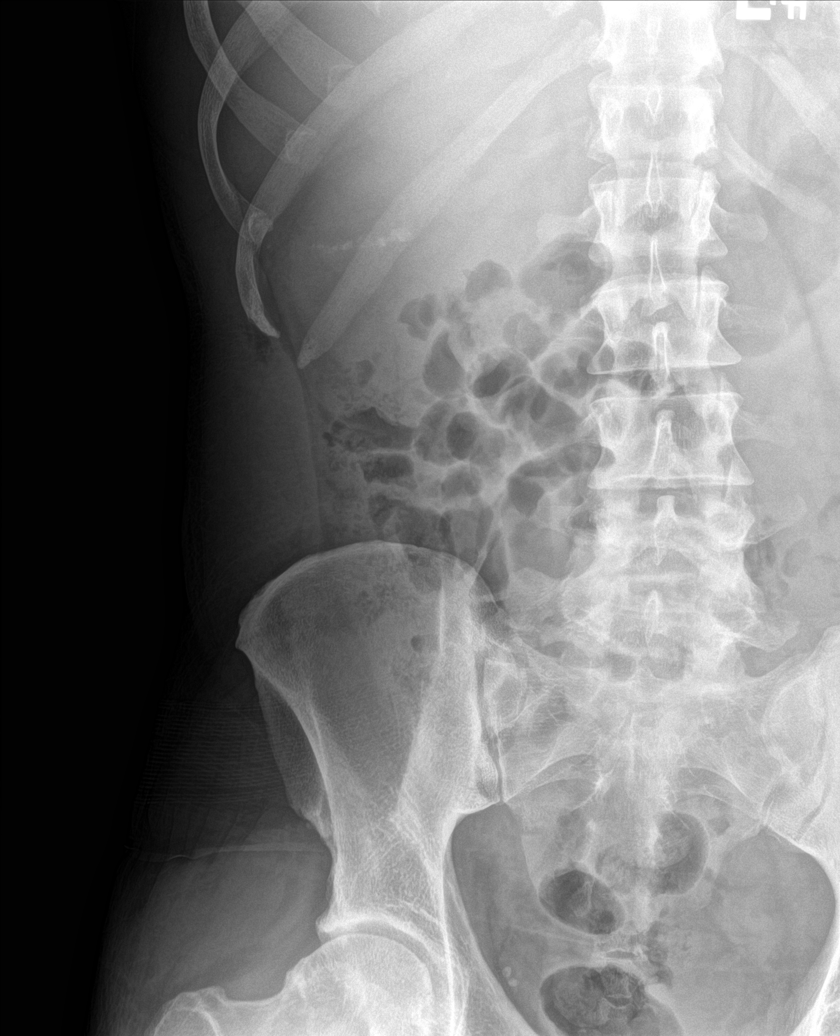

[l-spine obl (2 of 2)]
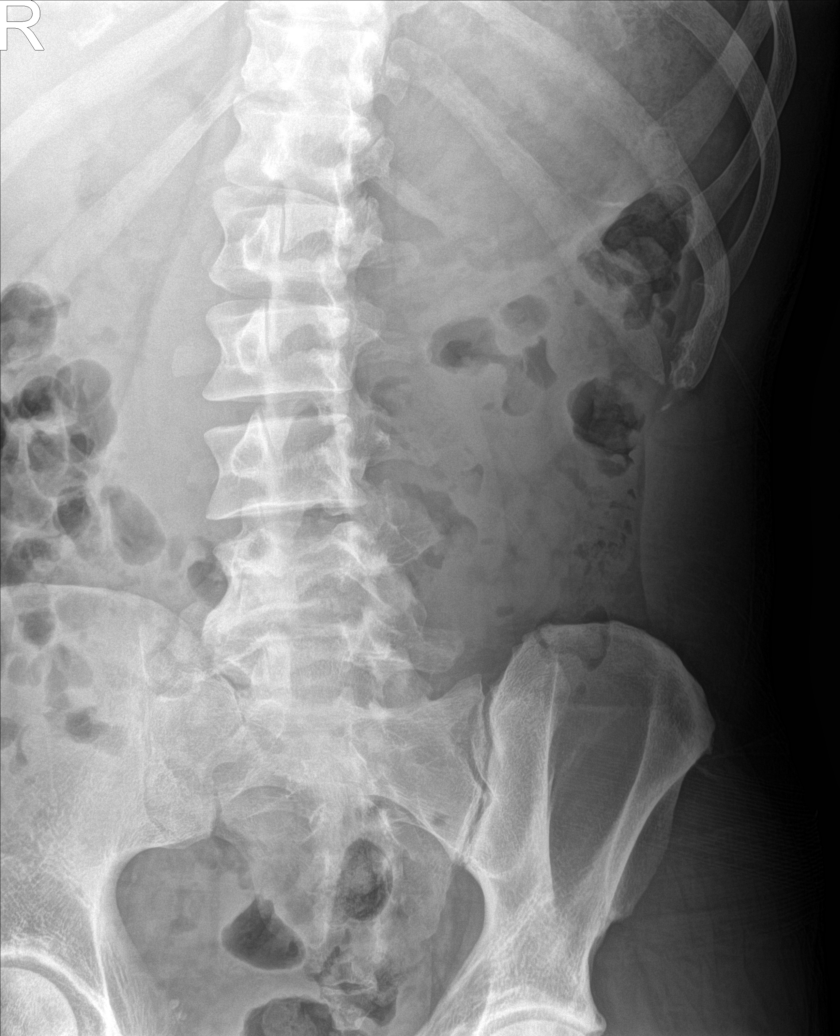

[l-spine lat]
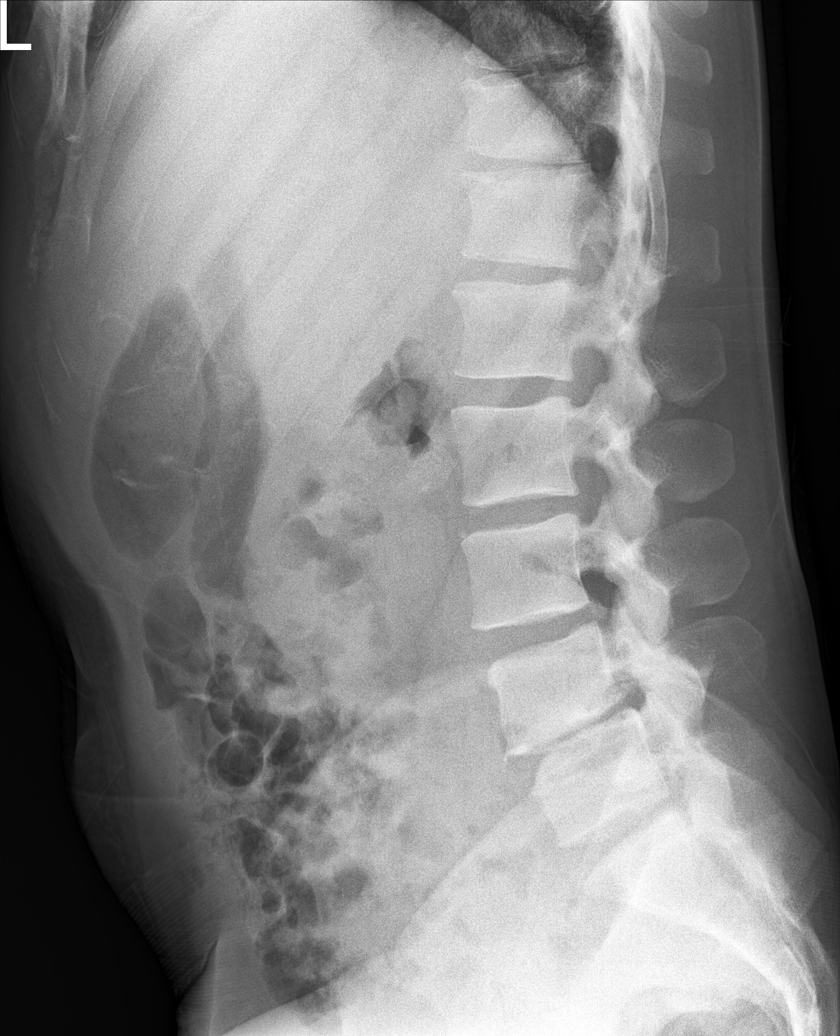

[l-spine l5-s1]
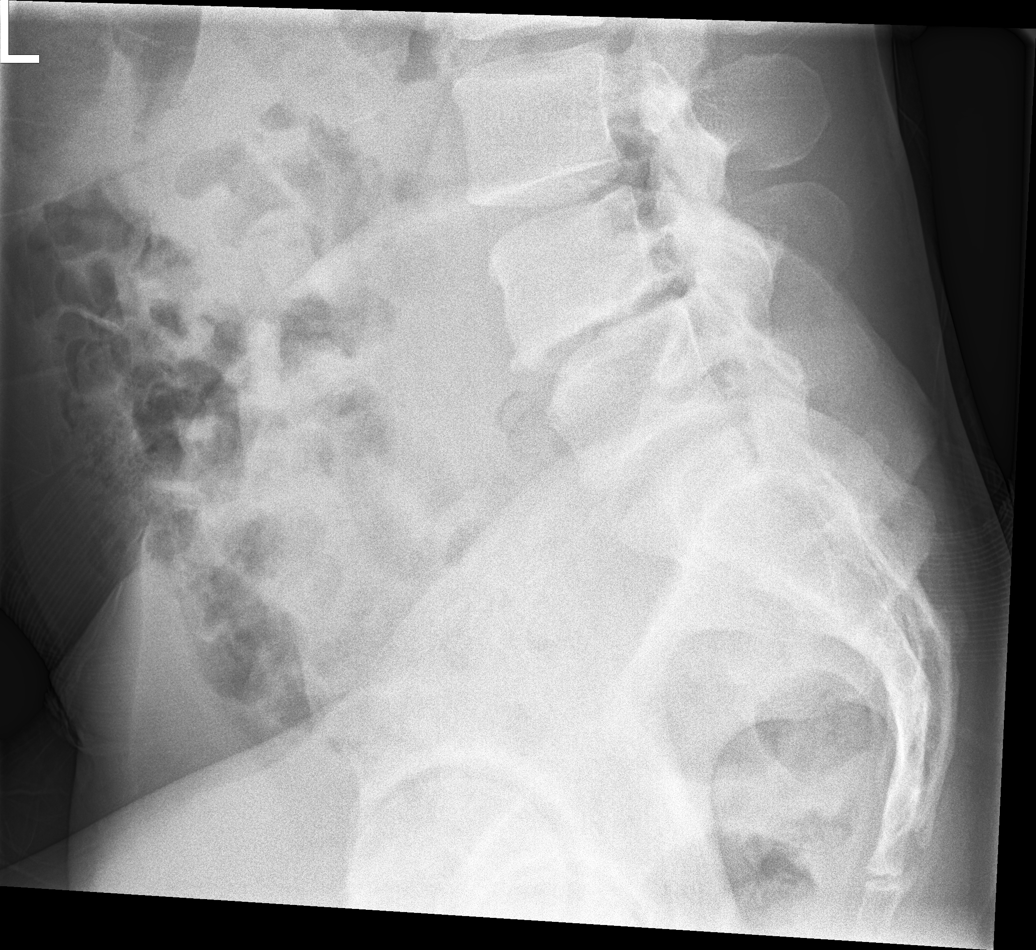

[5 of 5 positions shown; findings below may reference images not displayed]

FINDINGS: No fracture or spondylolisthesis is noted. Severe degenerative disc
disease is noted at L4-5. Remaining disc spaces appear intact
IMPRESSION: Severe degenerative disc disease is noted at L4-5. No acute
abnormality seen in the lumbar spine.

## 2018-12-26 ENCOUNTER — Telehealth: Payer: Self-pay | Admitting: *Deleted

## 2018-12-26 DIAGNOSIS — G4733 Obstructive sleep apnea (adult) (pediatric): Secondary | ICD-10-CM

## 2018-12-26 DIAGNOSIS — G47419 Narcolepsy without cataplexy: Secondary | ICD-10-CM

## 2018-12-26 NOTE — Telephone Encounter (Signed)
Referral placed, as requested

## 2018-12-26 NOTE — Addendum Note (Signed)
Addended by: Star Age on: 12/26/2018 05:29 PM   Modules accepted: Orders

## 2018-12-26 NOTE — Telephone Encounter (Signed)
Pt here need a referral for [facility] neuro. I have the notes.

## 2018-12-27 NOTE — Telephone Encounter (Signed)
[facility] called in and stated they do not treat

## 2019-01-03 NOTE — Telephone Encounter (Signed)
[facility] and patient is not out of network and they treat his DX as well referral re faxed patient aware. Marland Kitchen

## 2019-01-03 NOTE — Telephone Encounter (Signed)
Called and left message asking me to call him back.
